# Patient Record
Sex: Male | Born: 1985 | Race: Black or African American | Hispanic: No | Marital: Married | Smoking: Former smoker
Health system: Southern US, Community
[De-identification: ages and names within clinical notes are randomized; demographics above are authoritative.]

## PROBLEM LIST (undated history)

## (undated) DIAGNOSIS — M1990 Unspecified osteoarthritis, unspecified site: Secondary | ICD-10-CM

## (undated) DIAGNOSIS — T4145XA Adverse effect of unspecified anesthetic, initial encounter: Secondary | ICD-10-CM

## (undated) DIAGNOSIS — T8859XA Other complications of anesthesia, initial encounter: Secondary | ICD-10-CM

## (undated) DIAGNOSIS — M549 Dorsalgia, unspecified: Secondary | ICD-10-CM

## (undated) HISTORY — DX: Unspecified osteoarthritis, unspecified site: M19.90

## (undated) HISTORY — DX: Dorsalgia, unspecified: M54.9

## (undated) HISTORY — PX: MEDIAL COLLATERAL LIGAMENT AND LATERAL COLLATERAL LIGAMENT REPAIR, KNEE: SHX2017

## (undated) HISTORY — PX: ANTERIOR CRUCIATE LIGAMENT REPAIR: SHX115

---
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
## 2010-07-22 HISTORY — PX: MEDIAL COLLATERAL LIGAMENT AND LATERAL COLLATERAL LIGAMENT REPAIR, KNEE: SHX2017

## 2016-12-23 DIAGNOSIS — M9903 Segmental and somatic dysfunction of lumbar region: Secondary | ICD-10-CM | POA: Diagnosis not present

## 2016-12-23 DIAGNOSIS — M5416 Radiculopathy, lumbar region: Secondary | ICD-10-CM | POA: Diagnosis not present

## 2016-12-23 DIAGNOSIS — M9902 Segmental and somatic dysfunction of thoracic region: Secondary | ICD-10-CM | POA: Diagnosis not present

## 2016-12-23 DIAGNOSIS — M5431 Sciatica, right side: Secondary | ICD-10-CM | POA: Diagnosis not present

## 2018-04-10 ENCOUNTER — Ambulatory Visit (INDEPENDENT_AMBULATORY_CARE_PROVIDER_SITE_OTHER): Payer: BLUE CROSS/BLUE SHIELD | Admitting: Family Medicine

## 2018-04-10 ENCOUNTER — Encounter: Payer: Self-pay | Admitting: Family Medicine

## 2018-04-10 VITALS — BP 124/78 | HR 85 | Ht 72.0 in | Wt 374.5 lb

## 2018-04-10 DIAGNOSIS — Z114 Encounter for screening for human immunodeficiency virus [HIV]: Secondary | ICD-10-CM | POA: Diagnosis not present

## 2018-04-10 DIAGNOSIS — Z13220 Encounter for screening for lipoid disorders: Secondary | ICD-10-CM | POA: Diagnosis not present

## 2018-04-10 DIAGNOSIS — M5441 Lumbago with sciatica, right side: Secondary | ICD-10-CM | POA: Diagnosis not present

## 2018-04-10 DIAGNOSIS — G8929 Other chronic pain: Secondary | ICD-10-CM

## 2018-04-10 DIAGNOSIS — R030 Elevated blood-pressure reading, without diagnosis of hypertension: Secondary | ICD-10-CM | POA: Diagnosis not present

## 2018-04-10 LAB — MICROALBUMIN, URINE WAIVED
Creatinine, Urine Waived: 200 mg/dL (ref 10–300)
MICROALB, UR WAIVED: 80 mg/L — AB (ref 0–19)

## 2018-04-10 LAB — UA/M W/RFLX CULTURE, ROUTINE
Bilirubin, UA: NEGATIVE
GLUCOSE, UA: NEGATIVE
Ketones, UA: NEGATIVE
Nitrite, UA: NEGATIVE
PH UA: 6 (ref 5.0–7.5)
PROTEIN UA: NEGATIVE
RBC, UA: NEGATIVE
Specific Gravity, UA: 1.02 (ref 1.005–1.030)
Urobilinogen, Ur: 0.2 mg/dL (ref 0.2–1.0)

## 2018-04-10 LAB — MICROSCOPIC EXAMINATION: BACTERIA UA: NONE SEEN

## 2018-04-10 LAB — BAYER DCA HB A1C WAIVED: HB A1C (BAYER DCA - WAIVED): 6.1 % (ref <7.0)

## 2018-04-10 NOTE — Patient Instructions (Addendum)
Back Exercises  The following exercises strengthen the muscles that help to support the back. They also help to keep the lower back flexible. Doing these exercises can help to prevent back pain or lessen existing pain.  If you have back pain or discomfort, try doing these exercises 2–3 times each day or as told by your health care provider. When the pain goes away, do them once each day, but increase the number of times that you repeat the steps for each exercise (do more repetitions). If you do not have back pain or discomfort, do these exercises once each day or as told by your health care provider.  Exercises  Single Knee to Chest    Repeat these steps 3–5 times for each leg:  1. Lie on your back on a firm bed or the floor with your legs extended.  2. Bring one knee to your chest. Your other leg should stay extended and in contact with the floor.  3. Hold your knee in place by grabbing your knee or thigh.  4. Pull on your knee until you feel a gentle stretch in your lower back.  5. Hold the stretch for 10–30 seconds.  6. Slowly release and straighten your leg.    Pelvic Tilt    Repeat these steps 5–10 times:  1. Lie on your back on a firm bed or the floor with your legs extended.  2. Bend your knees so they are pointing toward the ceiling and your feet are flat on the floor.  3. Tighten your lower abdominal muscles to press your lower back against the floor. This motion will tilt your pelvis so your tailbone points up toward the ceiling instead of pointing to your feet or the floor.  4. With gentle tension and even breathing, hold this position for 5–10 seconds.    Cat–Cow    Repeat these steps until your lower back becomes more flexible:  1. Get into a hands-and-knees position on a firm surface. Keep your hands under your shoulders, and keep your knees under your hips. You may place padding under your knees for comfort.  2. Let your head hang down, and point your tailbone toward the floor so  your lower back becomes rounded like the back of a cat.  3. Hold this position for 5 seconds.  4. Slowly lift your head and point your tailbone up toward the ceiling so your back forms a sagging arch like the back of a cow.  5. Hold this position for 5 seconds.    Press-Ups    Repeat these steps 5–10 times:  1. Lie on your abdomen (face-down) on the floor.  2. Place your palms near your head, about shoulder-width apart.  3. While you keep your back as relaxed as possible and keep your hips on the floor, slowly straighten your arms to raise the top half of your body and lift your shoulders. Do not use your back muscles to raise your upper torso. You may adjust the placement of your hands to make yourself more comfortable.  4. Hold this position for 5 seconds while you keep your back relaxed.  5. Slowly return to lying flat on the floor.    Bridges    Repeat these steps 10 times:  1. Lie on your back on a firm surface.  2. Bend your knees so they are pointing toward the ceiling and your feet are flat on the floor.  3. Tighten your buttocks muscles and lift your buttocks   off of the floor until your waist is at almost the same height as your knees. You should feel the muscles working in your buttocks and the back of your thighs. If you do not feel these muscles, slide your feet 1–2 inches farther away from your buttocks.  4. Hold this position for 3–5 seconds.  5. Slowly lower your hips to the starting position, and allow your buttocks muscles to relax completely.    If this exercise is too easy, try doing it with your arms crossed over your chest.  Abdominal Crunches    Repeat these steps 5–10 times:  1. Lie on your back on a firm bed or the floor with your legs extended.  2. Bend your knees so they are pointing toward the ceiling and your feet are flat on the floor.  3. Cross your arms over your chest.  4. Tip your chin slightly toward your chest without bending your neck.   5. Tighten your abdominal muscles and slowly raise your trunk (torso) high enough to lift your shoulder blades a tiny bit off of the floor. Avoid raising your torso higher than that, because it can put too much stress on your low back and it does not help to strengthen your abdominal muscles.  6. Slowly return to your starting position.    Back Lifts  Repeat these steps 5–10 times:  1. Lie on your abdomen (face-down) with your arms at your sides, and rest your forehead on the floor.  2. Tighten the muscles in your legs and your buttocks.  3. Slowly lift your chest off of the floor while you keep your hips pressed to the floor. Keep the back of your head in line with the curve in your back. Your eyes should be looking at the floor.  4. Hold this position for 3–5 seconds.  5. Slowly return to your starting position.    Contact a health care provider if:  · Your back pain or discomfort gets much worse when you do an exercise.  · Your back pain or discomfort does not lessen within 2 hours after you exercise.  If you have any of these problems, stop doing these exercises right away. Do not do them again unless your health care provider says that you can.  Get help right away if:  · You develop sudden, severe back pain. If this happens, stop doing the exercises right away. Do not do them again unless your health care provider says that you can.  This information is not intended to replace advice given to you by your health care provider. Make sure you discuss any questions you have with your health care provider.  Document Released: 08/15/2004 Document Revised: 11/15/2015 Document Reviewed: 09/01/2014  Elsevier Interactive Patient Education © 2017 [employer]  DASH Eating Plan  DASH stands for "Dietary Approaches to Stop Hypertension." The DASH eating plan is a healthy eating plan that has been shown to reduce high blood pressure (hypertension). It may also reduce your risk for type 2 diabetes,  heart disease, and stroke. The DASH eating plan may also help with weight loss.  What are tips for following this plan?  General guidelines  · Avoid eating more than 2,300 mg (milligrams) of salt (sodium) a day. If you have hypertension, you may need to reduce your sodium intake to 1,500 mg a day.  · Limit alcohol intake to no more than 1 drink a day for nonpregnant women and 2 drinks a day for men.   One drink equals 12 oz of beer, 5 oz of wine, or 1½ oz of hard liquor.  · Work with your health care provider to maintain a healthy body weight or to lose weight. Ask what an ideal weight is for you.  · Get at least 30 minutes of exercise that causes your heart to beat faster (aerobic exercise) most days of the week. Activities may include walking, swimming, or biking.  · Work with your health care provider or diet and nutrition specialist (dietitian) to adjust your eating plan to your individual calorie needs.  Reading food labels  · Check food labels for the amount of sodium per serving. Choose foods with less than 5 percent of the Daily Value of sodium. Generally, foods with less than 300 mg of sodium per serving fit into this eating plan.  · To find whole grains, look for the word "whole" as the first word in the ingredient list.  Shopping  · Buy products labeled as "low-sodium" or "no salt added."  · Buy fresh foods. Avoid canned foods and premade or frozen meals.  Cooking  · Avoid adding salt when cooking. Use salt-free seasonings or herbs instead of table salt or sea salt. Check with your health care provider or pharmacist before using salt substitutes.  · Do not fry foods. Cook foods using healthy methods such as baking, boiling, grilling, and broiling instead.  · Cook with heart-healthy oils, such as olive, canola, soybean, or sunflower oil.  Meal planning    · Eat a balanced diet that includes:  ? 5 or more servings of fruits and vegetables each day. At each meal, try  to fill half of your plate with fruits and vegetables.  ? Up to 6–8 servings of whole grains each day.  ? Less than 6 oz of lean meat, poultry, or fish each day. A 3-oz serving of meat is about the same size as a deck of cards. One egg equals 1 oz.  ? 2 servings of low-fat dairy each day.  ? A serving of nuts, seeds, or beans 5 times each week.  ? Heart-healthy fats. Healthy fats called Omega-3 fatty acids are found in foods such as flaxseeds and coldwater fish, like sardines, salmon, and mackerel.  · Limit how much you eat of the following:  ? Canned or prepackaged foods.  ? Food that is high in trans fat, such as fried foods.  ? Food that is high in saturated fat, such as fatty meat.  ? Sweets, desserts, sugary drinks, and other foods with added sugar.  ? Full-fat dairy products.  · Do not salt foods before eating.  · Try to eat at least 2 vegetarian meals each week.  · Eat more home-cooked food and less restaurant, buffet, and fast food.  · When eating at a restaurant, ask that your food be prepared with less salt or no salt, if possible.  What foods are recommended?  The items listed may not be a complete list. Talk with your dietitian about what dietary choices are best for you.  Grains  Whole-grain or whole-wheat bread. Whole-grain or whole-wheat pasta. Brown rice. Oatmeal. Quinoa. Bulgur. Whole-grain and low-sodium cereals. Pita bread. Low-fat, low-sodium crackers. Whole-wheat flour tortillas.  Vegetables  Fresh or frozen vegetables (raw, steamed, roasted, or grilled). Low-sodium or reduced-sodium tomato and vegetable juice. Low-sodium or reduced-sodium tomato sauce and tomato paste. Low-sodium or reduced-sodium canned vegetables.  Fruits  All fresh, dried, or frozen fruit. Canned fruit in natural juice (without   added sugar).  Meat and other protein foods  Skinless chicken or turkey. Ground chicken or turkey. Pork with fat trimmed off. Fish and seafood. Egg whites. Dried beans, peas, or lentils.  Unsalted nuts, nut butters, and seeds. Unsalted canned beans. Lean cuts of beef with fat trimmed off. Low-sodium, lean deli meat.  Dairy  Low-fat (1%) or fat-free (skim) milk. Fat-free, low-fat, or reduced-fat cheeses. Nonfat, low-sodium ricotta or cottage cheese. Low-fat or nonfat yogurt. Low-fat, low-sodium cheese.  Fats and oils  Soft margarine without trans fats. Vegetable oil. Low-fat, reduced-fat, or light mayonnaise and salad dressings (reduced-sodium). Canola, safflower, olive, soybean, and sunflower oils. Avocado.  Seasoning and other foods  Herbs. Spices. Seasoning mixes without salt. Unsalted popcorn and pretzels. Fat-free sweets.  What foods are not recommended?  The items listed may not be a complete list. Talk with your dietitian about what dietary choices are best for you.  Grains  Baked goods made with fat, such as croissants, muffins, or some breads. Dry pasta or rice meal packs.  Vegetables  Creamed or fried vegetables. Vegetables in a cheese sauce. Regular canned vegetables (not low-sodium or reduced-sodium). Regular canned tomato sauce and paste (not low-sodium or reduced-sodium). Regular tomato and vegetable juice (not low-sodium or reduced-sodium). Pickles. Olives.  Fruits  Canned fruit in a light or heavy syrup. Fried fruit. Fruit in cream or butter sauce.  Meat and other protein foods  Fatty cuts of meat. Ribs. Fried meat. Bacon. Sausage. Bologna and other processed lunch meats. Salami. Fatback. Hotdogs. Bratwurst. Salted nuts and seeds. Canned beans with added salt. Canned or smoked fish. Whole eggs or egg yolks. Chicken or turkey with skin.  Dairy  Whole or 2% milk, cream, and half-and-half. Whole or full-fat cream cheese. Whole-fat or sweetened yogurt. Full-fat cheese. Nondairy creamers. Whipped toppings. Processed cheese and cheese spreads.  Fats and oils  Butter. Stick margarine. Lard. Shortening. Ghee. Bacon fat. Tropical oils, such as coconut, palm kernel, or palm oil.   Seasoning and other foods  Salted popcorn and pretzels. Onion salt, garlic salt, seasoned salt, table salt, and sea salt. Worcestershire sauce. Tartar sauce. Barbecue sauce. Teriyaki sauce. Soy sauce, including reduced-sodium. Steak sauce. Canned and packaged gravies. Fish sauce. Oyster sauce. Cocktail sauce. Horseradish that you find on the shelf. Ketchup. Mustard. Meat flavorings and tenderizers. Bouillon cubes. Hot sauce and Tabasco sauce. Premade or packaged marinades. Premade or packaged taco seasonings. Relishes. Regular salad dressings.  Where to find more information:  · National Heart, Lung, and Blood Institute: www.nhlbi.nih.gov  · American Heart Association: www.heart.org  Summary  · The DASH eating plan is a healthy eating plan that has been shown to reduce high blood pressure (hypertension). It may also reduce your risk for type 2 diabetes, heart disease, and stroke.  · With the DASH eating plan, you should limit salt (sodium) intake to 2,300 mg a day. If you have hypertension, you may need to reduce your sodium intake to 1,500 mg a day.  · When on the DASH eating plan, aim to eat more fresh fruits and vegetables, whole grains, lean proteins, low-fat dairy, and heart-healthy fats.  · Work with your health care provider or diet and nutrition specialist (dietitian) to adjust your eating plan to your individual calorie needs.  This information is not intended to replace advice given to you by your health care provider. Make sure you discuss any questions you have with your health care provider.  Document Released: 06/27/2011 Document Revised: 07/01/2016 Document Reviewed: 07/01/2016    Elsevier Interactive Patient Education © 2018 [employer]
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
## 2018-04-10 NOTE — Assessment & Plan Note (Signed)
Discussed slow, steady weight loss of 1-2lbs/week. DASH diet given. Increase exercise. Checking labs today. Await results.

## 2018-04-11 LAB — CBC WITH DIFFERENTIAL/PLATELET
BASOS ABS: 0 10*3/uL (ref 0.0–0.2)
BASOS: 0 %
EOS (ABSOLUTE): 0.2 10*3/uL (ref 0.0–0.4)
Eos: 3 %
HEMOGLOBIN: 14.1 g/dL (ref 13.0–17.7)
Hematocrit: 40.4 % (ref 37.5–51.0)
IMMATURE GRANULOCYTES: 0 %
Immature Grans (Abs): 0 10*3/uL (ref 0.0–0.1)
LYMPHS: 26 %
Lymphocytes Absolute: 1.5 10*3/uL (ref 0.7–3.1)
MCH: 29 pg (ref 26.6–33.0)
MCHC: 34.9 g/dL (ref 31.5–35.7)
MCV: 83 fL (ref 79–97)
MONOCYTES: 5 %
Monocytes Absolute: 0.3 10*3/uL (ref 0.1–0.9)
NEUTROS ABS: 3.8 10*3/uL (ref 1.4–7.0)
NEUTROS PCT: 66 %
Platelets: 252 10*3/uL (ref 150–450)
RBC: 4.87 x10E6/uL (ref 4.14–5.80)
RDW: 13.7 % (ref 12.3–15.4)
WBC: 5.7 10*3/uL (ref 3.4–10.8)

## 2018-04-11 LAB — COMPREHENSIVE METABOLIC PANEL
ALBUMIN: 4.7 g/dL (ref 3.5–5.5)
ALT: 33 IU/L (ref 0–44)
AST: 28 IU/L (ref 0–40)
Albumin/Globulin Ratio: 2 (ref 1.2–2.2)
Alkaline Phosphatase: 101 IU/L (ref 39–117)
BILIRUBIN TOTAL: 0.3 mg/dL (ref 0.0–1.2)
BUN / CREAT RATIO: 11 (ref 9–20)
BUN: 11 mg/dL (ref 6–20)
CALCIUM: 9.5 mg/dL (ref 8.7–10.2)
CO2: 23 mmol/L (ref 20–29)
CREATININE: 0.96 mg/dL (ref 0.76–1.27)
Chloride: 100 mmol/L (ref 96–106)
GFR, EST AFRICAN AMERICAN: 119 mL/min/{1.73_m2} (ref >59)
GFR, EST NON AFRICAN AMERICAN: 103 mL/min/{1.73_m2} (ref >59)
GLUCOSE: 120 mg/dL — AB (ref 65–99)
Globulin, Total: 2.4 g/dL (ref 1.5–4.5)
Potassium: 4.3 mmol/L (ref 3.5–5.2)
Sodium: 139 mmol/L (ref 134–144)
Total Protein: 7.1 g/dL (ref 6.0–8.5)

## 2018-04-11 LAB — LIPID PANEL W/O CHOL/HDL RATIO
Cholesterol, Total: 192 mg/dL (ref 100–199)
HDL: 39 mg/dL — ABNORMAL LOW (ref >39)
LDL Calculated: 136 mg/dL — ABNORMAL HIGH (ref 0–99)
Triglycerides: 87 mg/dL (ref 0–149)
VLDL CHOLESTEROL CAL: 17 mg/dL (ref 5–40)

## 2018-04-11 LAB — HIV ANTIBODY (ROUTINE TESTING W REFLEX): HIV Screen 4th Generation wRfx: NONREACTIVE

## 2018-04-11 LAB — TSH: TSH: 1.33 u[IU]/mL (ref 0.450–4.500)

## 2018-04-13 ENCOUNTER — Encounter: Payer: Self-pay | Admitting: Family Medicine

## 2018-06-02 ENCOUNTER — Encounter: Payer: Self-pay | Admitting: Family Medicine

## 2018-06-02 ENCOUNTER — Ambulatory Visit: Payer: BLUE CROSS/BLUE SHIELD | Admitting: Family Medicine

## 2018-06-02 VITALS — BP 148/75 | HR 72 | Temp 97.8°F | Wt 358.0 lb

## 2018-06-02 DIAGNOSIS — M25512 Pain in left shoulder: Secondary | ICD-10-CM

## 2018-06-02 DIAGNOSIS — M25511 Pain in right shoulder: Secondary | ICD-10-CM | POA: Diagnosis not present

## 2018-06-02 DIAGNOSIS — M7520 Bicipital tendinitis, unspecified shoulder: Secondary | ICD-10-CM

## 2018-06-02 DIAGNOSIS — M7662 Achilles tendinitis, left leg: Secondary | ICD-10-CM | POA: Diagnosis not present

## 2018-06-02 MED ORDER — NAPROXEN 500 MG PO TABS: 500.0000 mg | ORAL_TABLET | Freq: Two times a day (BID) | ORAL | 3 refills | Status: DC

## 2018-06-02 NOTE — Patient Instructions (Signed)
Achilles Tendinitis Rehab Ask your health care provider which exercises are safe for you. Do exercises exactly as told by your health care provider and adjust them as directed. It is normal to feel mild stretching, pulling, tightness, or discomfort as you do these exercises, but you should stop right away if you feel sudden pain or your pain gets worse. Do not begin these exercises until told by your health care provider. Stretching and range of motion exercises These exercises warm up your muscles and joints and improve the movement and flexibility of your ankle. These exercises also help to relieve pain, numbness, and tingling. Exercise A: Standing wall calf stretch, knee straight  1. Stand with your hands against a wall. 2. Extend your __________ leg behind you and bend your front knee slightly. Keep both of your heels on the floor. 3. Point the toes of your back foot slightly inward. 4. Keeping your heels on the floor and your back knee straight, shift your weight toward the wall. Do not allow your back to arch. You should feel a gentle stretch in your calf. 5. Hold this position for seconds. Repeat __________ times. Complete this stretch __________ times per day. Exercise B: Standing wall calf stretch, knee bent 1. Stand with your hands against a wall. 2. Extend your __________ leg behind you, and bend your front knee slightly. Keep both of your heels on the floor. 3. Point the toes of your back foot slightly inward. 4. Keeping your heels on the floor, unlock your back knee so that it is bent. You should feel a gentle stretch deep in your calf. 5. Hold this position for __________ seconds. Repeat __________ times. Complete this stretch __________ times per day. Strengthening exercises These exercises build strength and control of your ankle. Endurance is the ability to use your muscles for a long time, even after they get tired. Exercise C: Plantar flexion with band  1. Sit on the floor  with your __________ leg extended. You may put a pillow under your calf to give your foot more room to move. 2. Loop a rubber exercise band or tube around the ball of your __________ foot. The ball of your foot is on the walking surface, right under your toes. The band or tube should be slightly tense when your foot is relaxed. If the band or tube slips, you can put on your shoe or put a washcloth between the band and your foot to help it stay in place. 3. Slowly point your toes downward, pushing them away from you. 4. Hold this position for __________ seconds. 5. Slowly release the tension in the band or tube, controlling smoothly until your foot is back to the starting position. Repeat __________ times. Complete this exercise __________ times per day. Exercise D: Heel raise with eccentric lower  1. Stand on a step with the balls of your feet. The ball of your foot is on the walking surface, right under your toes. ? Do not put your heels on the step. ? For balance, rest your hands on the wall or on a railing. 2. Rise up onto the balls of your feet. 3. Keeping your heels up, shift all of your weight to your __________ leg and pick up your other leg. 4. Slowly lower your __________ leg so your heel drops below the level of the step. 5. Put down your foot. If told by your health care provider, build up to:  3 sets of 15 repetitions while keeping your knees   straight.  3 sets of 15 repetitions while keeping your knees bent as far as told by your health care provider.  Complete this exercise __________ times per day. If this exercise is too easy, try doing it while wearing a backpack with weights in it. Balance exercises These exercises improve or maintain your balance. Balance is important in preventing falls. Exercise E: Single leg stand 1. Without shoes, stand near a railing or in a door frame. Hold on to the railing or door frame as needed. 2. Stand on your __________ foot. Keep your big  toe down on the floor and try to keep your arch lifted. 3. Hold this position for __________ seconds. Repeat __________ times. Complete this exercise __________ times per day. If this exercise is too easy, you can try it with your eyes closed or while standing on a pillow. This information is not intended to replace advice given to you by your health care provider. Make sure you discuss any questions you have with your health care provider. Document Released: 02/06/2005 Document Revised: 03/14/2016 Document Reviewed: 03/14/2015 Elsevier Interactive Patient Education  2018 [employer]  Biceps Tendon Disruption (Proximal) Rehab Ask your health care provider which exercises are safe for you. Do exercises exactly as told by your health care provider and adjust them as directed. It is normal to feel mild stretching, pulling, tightness, or discomfort as you do these exercises, but you should stop right away if you feel sudden pain or your pain gets worse.Do not begin these exercises until told by your health care provider. Stretching and range of motion exercises These exercises warm up your muscles and joints and improve the movement and flexibility of your arm and shoulder. These exercises also help to relieve pain and stiffness. Exercise A: Shoulder flexion, standing  1. Stand facing a wall. Put your left / right hand on the wall. 2. Slide your left / right hand up the wall. Stop when you feel a stretch in your shoulder, or when you reach the angle recommended by your health care provider. ? Use your other hand to help raise your arm, if needed. ? As your hand gets higher, you may need to step closer to the wall. ? Avoid shrugging your shoulder while you raise your arm. To do this, keep your shoulder blade tucked down toward your spine. 3. Hold for __________ seconds. 4. Slowly return to the starting position. Use your other arm to help, if needed. Repeat __________ times. Complete this  exercise __________ times a day. Exercise B: Pendulum  1. Stand near a wall or a surface that you can hold onto for balance. 2. Bend at the waist and let your left / right arm hang straight down. Use your other arm to support you. 3. Relax your arm and shoulder muscles, and move your hips and your trunk so your left / right arm swings freely. Your arm should swing because of the motion of your body, not because you are using your arm or shoulder muscles. 4. Keep moving so your arm swings in the following directions, as told by your health care provider: ? Side to side. ? Forward and backward. ? In clockwise and counterclockwise circles. 5. Slowly return to the starting position. Repeat __________ times. Complete this exercise __________ times a day. Strengthening exercises These exercises build strength and endurance in your arm and shoulder. Endurance is the ability to use your muscles for a long time, even after your muscles get tired. Exercise C: Elbow  flexion, neutral 1. Sit on a stable chair without armrests, or stand. 2. Hold a __________ weight in your left / right hand, or hold an exercise band with both hands. Your palms should face each other at the starting position. 3. Bend your left / right elbow and move your hand up toward your shoulder. ? Lead with your thumb, and keep your palm facing the same direction. ? Keep your other arm straight down, in the starting position. 4. Slowly return to the starting position. Repeat __________ times. Complete this exercise __________ times a day. Exercise D: Forearm supination  1. Sit with your left / right forearm on a table. Your elbow should be below shoulder height. Rest your hand over the edge of the table so your palm faces down. 2. If directed, hold a hammer with your left / right hand. 3. Without moving your elbow, slowly rotate your hand so your palm faces up toward the ceiling. ? If you are holding a hammer, begin by holding the  hammer near the head. When this exercise gets easier for you, hold the hammer farther down the handle. 4. Hold for __________ seconds. 5. Slowly return to the starting position. Repeat __________ times. Complete this exercise __________ times a day. Exercise E: Scapular retraction  1. Sit in a stable chair without armrests, or stand. 2. Secure an exercise band to a stable object in front of you so the band is at shoulder height. 3. Hold one end of the exercise band in each hand. 4. Squeeze your shoulder blades together and move your elbows slightly behind you. Do not shrug your shoulders. 5. Hold for __________ seconds. 6. Slowly return to the starting position. Repeat __________ times. Complete this exercise __________ times a day. Exercise F: Scapular protraction, supine  1. Lie on your back on a firm surface. Hold a __________ weight in your left / right hand. 2. Raise your left / right arm straight into the air so your hand is directly above your shoulder joint. 3. Push the weight into the air so your shoulder lifts off of the surface that you are lying on. Do not move your head, neck, or back. 4. Hold for __________ seconds. 5. Slowly return to the starting position. Let your muscles relax completely before you repeat this exercise. Repeat __________ times. Complete this exercise __________ times a day. This information is not intended to replace advice given to you by your health care provider. Make sure you discuss any questions you have with your health care provider. Document Released: 07/08/2005 Document Revised: 03/14/2016 Document Reviewed: 06/16/2015 Elsevier Interactive Patient Education  2018 [employer]  Shoulder Impingement Syndrome Rehab Ask your health care provider which exercises are safe for you. Do exercises exactly as told by your health care provider and adjust them as directed. It is normal to feel mild stretching, pulling, tightness, or discomfort as you do  these exercises, but you should stop right away if you feel sudden pain or your pain gets worse.Do not begin these exercises until told by your health care provider. Stretching and range of motion exercise This exercise warms up your muscles and joints and improves the movement and flexibility of your shoulder. This exercise also helps to relieve pain and stiffness. Exercise A: Passive horizontal adduction  1. Sit or stand and pull your left / right elbow across your chest, toward your other shoulder. Stop when you feel a gentle stretch in the back of your shoulder and upper arm. ?  Keep your arm at shoulder height. ? Keep your arm as close to your body as you comfortably can. 2. Hold for __________ seconds. 3. Slowly return to the starting position. Repeat __________ times. Complete this exercise __________ times a day. Strengthening exercises These exercises build strength and endurance in your shoulder. Endurance is the ability to use your muscles for a long time, even after they get tired. Exercise B: External rotation, isometric 1. Stand or sit in a doorway, facing the door frame. 2. Bend your left / right elbow and place the back of your wrist against the door frame. Only your wrist should be touching the frame. Keep your upper arm at your side. 3. Gently press your wrist against the door frame, as if you are trying to push your arm away from your abdomen. ? Avoid shrugging your shoulder while you press your hand against the door frame. Keep your shoulder blade tucked down toward the middle of your back. 4. Hold for __________ seconds. 5. Slowly release the tension, and relax your muscles completely before you do the exercise again. Repeat __________ times. Complete this exercise __________ times a day. Exercise C: Internal rotation, isometric  1. Stand or sit in a doorway, facing the door frame. 2. Bend your left / right elbow and place the inside of your wrist against the door frame.  Only your wrist should be touching the frame. Keep your upper arm at your side. 3. Gently press your wrist against the door frame, as if you are trying to push your arm toward your abdomen. ? Avoid shrugging your shoulder while you press your hand against the door frame. Keep your shoulder blade tucked down toward the middle of your back. 4. Hold for __________ seconds. 5. Slowly release the tension, and relax your muscles completely before you do the exercise again. Repeat __________ times. Complete this exercise __________ times a day. Exercise D: Scapular protraction, supine  1. Lie on your back on a firm surface. Hold a __________ weight in your left / right hand. 2. Raise your left / right arm straight into the air so your hand is directly above your shoulder joint. 3. Push the weight into the air so your shoulder lifts off of the surface that you are lying on. Do not move your head, neck, or back. 4. Hold for __________ seconds. 5. Slowly return to the starting position. Let your muscles relax completely before you repeat this exercise. Repeat __________ times. Complete this exercise __________ times a day. Exercise E: Scapular retraction  1. Sit in a stable chair without armrests, or stand. 2. Secure an exercise band to a stable object in front of you so the band is at shoulder height. 3. Hold one end of the exercise band in each hand. Your palms should face down. 4. Squeeze your shoulder blades together and move your elbows slightly behind you. Do not shrug your shoulders while you do this. 5. Hold for __________ seconds. 6. Slowly return to the starting position. Repeat __________ times. Complete this exercise __________ times a day. Exercise F: Shoulder extension  1. Sit in a stable chair without armrests, or stand. 2. Secure an exercise band to a stable object in front of you where the band is above shoulder height. 3. Hold one end of the exercise band in each  hand. 4. Straighten your elbows and lift your hands up to shoulder height. 5. Squeeze your shoulder blades together and pull your hands down to the sides of  your thighs. Stop when your hands are straight down by your sides. Do not let your hands go behind your body. 6. Hold for __________ seconds. 7. Slowly return to the starting position. Repeat __________ times. Complete this exercise __________ times a day. This information is not intended to replace advice given to you by your health care provider. Make sure you discuss any questions you have with your health care provider. Document Released: 07/08/2005 Document Revised: 03/14/2016 Document Reviewed: 06/10/2015 Elsevier Interactive Patient Education  [employer].

## 2018-06-02 NOTE — Progress Notes (Signed)
BP (!) 148/75   Pulse 72   Temp 97.8 F (36.6 C) (Oral)   Wt (!) 358 lb (162.4 kg)   SpO2 100%   BMI 48.55 kg/m    Subjective:    Patient ID: Jimmy Flores, male    DOB: 09-05-1985, 32 y.o.   MRN: [id number]  HPI: Jimmy Flores is a 32 y.o. male  Chief Complaint  Patient presents with  . Shoulder Pain  . Tendonitis   SHOULDER PAIN-Has been trying to get back into shape. In the gym, doing weights. Has been having some pain in his shoulders Duration: past few weeks Involved shoulder: bilateral Mechanism of injury: lifting at the gym Location: anterior Onset:gradual Severity: moderate  Quality:  Aching and sore, also shooting down his L arm Frequency: intermittent Radiation: yes Aggravating factors: lifting  Alleviating factors: rest  Status: worse Treatments attempted: rest  Relief with NSAIDs?:  No NSAIDs Taken Weakness: no Numbness: no Decreased grip strength: no Redness: no Swelling: no Bruising: no Fevers: no   Achilles also seems to be acting up   Relevant past medical, surgical, family and social history reviewed and updated as indicated. Interim medical history since our last visit reviewed. Allergies and medications reviewed and updated.  Review of Systems  Constitutional: Negative.   Respiratory: Negative.   Cardiovascular: Negative.   Musculoskeletal: Positive for arthralgias and myalgias. Negative for back pain, gait problem, joint swelling, neck pain and neck stiffness.  Skin: Negative.   Neurological: Negative.   Hematological: Negative.   Psychiatric/Behavioral: Negative.     Per HPI unless specifically indicated above     Objective:    BP (!) 148/75   Pulse 72   Temp 97.8 F (36.6 C) (Oral)   Wt (!) 358 lb (162.4 kg)   SpO2 100%   BMI 48.55 kg/m   Wt Readings from Last 3 Encounters:  06/02/18 (!) 358 lb (162.4 kg)  04/10/18 (!) 374 lb 8 oz (169.9 kg)    Physical Exam  Constitutional: He is oriented to person, place,  and time. He appears well-developed and well-nourished. No distress.  HENT:  Head: Normocephalic and atraumatic.  Right Ear: Hearing normal.  Left Ear: Hearing normal.  Nose: Nose normal.  Eyes: Conjunctivae and lids are normal. Right eye exhibits no discharge. Left eye exhibits no discharge. No scleral icterus.  Cardiovascular: Normal rate, regular rhythm, normal heart sounds and intact distal pulses. Exam reveals no gallop and no friction rub.  No murmur heard. Pulmonary/Chest: Effort normal and breath sounds normal. No stridor. No respiratory distress. He has no wheezes. He has no rales. He exhibits no tenderness.  Musculoskeletal: Normal range of motion. He exhibits tenderness. He exhibits no edema or deformity.  FROM of shoulders, tender over AC joints bilaterally, tenderness over bicipital grove on the L, tenderness to achilles tendon on the L  Neurological: He is alert and oriented to person, place, and time.  Skin: Skin is warm, dry and intact. Capillary refill takes less than 2 seconds. No rash noted. He is not diaphoretic. No erythema. No pallor.  Psychiatric: He has a normal mood and affect. His speech is normal and behavior is normal. Judgment and thought content normal. Cognition and memory are normal.  Nursing note and vitals reviewed.   Results for orders placed or performed in visit on 04/10/18  Microscopic Examination  Result Value Ref Range   WBC, UA 0-5 0 - 5 /hpf   RBC, UA 0-2 0 - 2 /hpf  Epithelial Cells (non renal) CANCELED    Bacteria, UA None seen None seen/Few  HIV Antibody (routine testing w rflx)  Result Value Ref Range   HIV Screen 4th Generation wRfx Non Reactive Non Reactive  Bayer DCA Hb A1c Waived  Result Value Ref Range   HB A1C (BAYER DCA - WAIVED) 6.1 <7.0 %  CBC with Differential/Platelet  Result Value Ref Range   WBC 5.7 3.4 - 10.8 x10E3/uL   RBC 4.87 4.14 - 5.80 x10E6/uL   Hemoglobin 14.1 13.0 - 17.7 g/dL   Hematocrit 16.1 09.6 - 51.0 %    MCV 83 79 - 97 fL   MCH 29.0 26.6 - 33.0 pg   MCHC 34.9 31.5 - 35.7 g/dL   RDW 04.5 40.9 - 81.1 %   Platelets 252 150 - 450 x10E3/uL   Neutrophils 66 Not Estab. %   Lymphs 26 Not Estab. %   Monocytes 5 Not Estab. %   Eos 3 Not Estab. %   Basos 0 Not Estab. %   Neutrophils Absolute 3.8 1.4 - 7.0 x10E3/uL   Lymphocytes Absolute 1.5 0.7 - 3.1 x10E3/uL   Monocytes Absolute 0.3 0.1 - 0.9 x10E3/uL   EOS (ABSOLUTE) 0.2 0.0 - 0.4 x10E3/uL   Basophils Absolute 0.0 0.0 - 0.2 x10E3/uL   Immature Granulocytes 0 Not Estab. %   Immature Grans (Abs) 0.0 0.0 - 0.1 x10E3/uL  Comprehensive metabolic panel  Result Value Ref Range   Glucose 120 (H) 65 - 99 mg/dL   BUN 11 6 - 20 mg/dL   Creatinine, Ser 9.14 0.76 - 1.27 mg/dL   GFR calc non Af Amer 103 >59 mL/min/1.73   GFR calc Af Amer 119 >59 mL/min/1.73   BUN/Creatinine Ratio 11 9 - 20   Sodium 139 134 - 144 mmol/L   Potassium 4.3 3.5 - 5.2 mmol/L   Chloride 100 96 - 106 mmol/L   CO2 23 20 - 29 mmol/L   Calcium 9.5 8.7 - 10.2 mg/dL   Total Protein 7.1 6.0 - 8.5 g/dL   Albumin 4.7 3.5 - 5.5 g/dL   Globulin, Total 2.4 1.5 - 4.5 g/dL   Albumin/Globulin Ratio 2.0 1.2 - 2.2   Bilirubin Total 0.3 0.0 - 1.2 mg/dL   Alkaline Phosphatase 101 39 - 117 IU/L   AST 28 0 - 40 IU/L   ALT 33 0 - 44 IU/L  Lipid Panel w/o Chol/HDL Ratio  Result Value Ref Range   Cholesterol, Total 192 100 - 199 mg/dL   Triglycerides 87 0 - 149 mg/dL   HDL 39 (L) >78 mg/dL   VLDL Cholesterol Cal 17 5 - 40 mg/dL   LDL Calculated 295 (H) 0 - 99 mg/dL  Microalbumin, Urine Waived  Result Value Ref Range   Microalb, Ur Waived 80 (H) 0 - 19 mg/L   Creatinine, Urine Waived 200 10 - 300 mg/dL   Microalb/Creat Ratio 30-300 (H) <30 mg/g  TSH  Result Value Ref Range   TSH 1.330 0.450 - 4.500 uIU/mL  UA/M w/rflx Culture, Routine  Result Value Ref Range   Specific Gravity, UA 1.020 1.005 - 1.030   pH, UA 6.0 5.0 - 7.5   Color, UA Yellow Yellow   Appearance Ur Clear Clear    Leukocytes, UA Trace (A) Negative   Protein, UA Negative Negative/Trace   Glucose, UA Negative Negative   Ketones, UA Negative Negative   RBC, UA Negative Negative   Bilirubin, UA Negative Negative   Urobilinogen, Ur 0.2 0.2 -  1.0 mg/dL   Nitrite, UA Negative Negative   Microscopic Examination See below:       Assessment & Plan:   Problem List Items Addressed This Visit    None    Visit Diagnoses    Biceps tendinitis, unspecified laterality    -  Primary   Naproxen and stretches. If not getting better or getting worse, call and we'll get x-rays and referral to ortho.   Achilles tendinitis of left lower extremity       Naproxen and stretches. If not getting better or getting worse, call and we'll get x-rays and referral to ortho   Arthralgia of both acromioclavicular joints       Naproxen and stretches. If not getting better or getting worse, call and we'll get x-rays and referral to ortho       Follow up plan: Return if symptoms worsen or fail to improve.

## 2018-07-03 ENCOUNTER — Encounter: Payer: Self-pay | Admitting: Nurse Practitioner

## 2018-07-03 ENCOUNTER — Ambulatory Visit: Payer: BLUE CROSS/BLUE SHIELD | Admitting: Nurse Practitioner

## 2018-07-03 VITALS — BP 135/80 | HR 64 | Temp 98.1°F | Ht 72.0 in | Wt 344.6 lb

## 2018-07-03 DIAGNOSIS — M545 Low back pain, unspecified: Secondary | ICD-10-CM | POA: Insufficient documentation

## 2018-07-03 MED ORDER — CYCLOBENZAPRINE HCL 5 MG PO TABS: 5.0000 mg | ORAL_TABLET | Freq: Three times a day (TID) | ORAL | 0 refills | Status: DC | PRN

## 2018-07-03 NOTE — Assessment & Plan Note (Signed)
Acute, with some improvement after chiropractor.  Script for Flexeril 5 MG TID PRN #30 pills sent.  Educated not to take medication prior to work or while driving.  Recommended use of heat and Icy/Hot Lidocaine patches (on during day and off at night) at home.  Biofreeze as needed.  Return for worsening or continued symptoms.
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
## 2018-07-03 NOTE — Patient Instructions (Addendum)
Back Pain, Adult Back pain is very common. The pain often gets better over time. The cause of back pain is usually not dangerous. Most people can learn to manage their back pain on their own. Follow these instructions at home: Watch your back pain for any changes. The following actions may help to lessen any pain you are feeling:  Stay active. Start with short walks on flat ground if you can. Try to walk farther each day.  Exercise regularly as told by your doctor. Exercise helps your back heal faster. It also helps avoid future injury by keeping your muscles strong and flexible.  Do not sit, drive, or stand in one place for more than 30 minutes.  Do not stay in bed. Resting more than 1-2 days can slow down your recovery.  Be careful when you bend or lift an object. Use good form when lifting: ? Bend at your knees. ? Keep the object close to your body. ? Do not twist.  Sleep on a firm mattress. Lie on your side, and bend your knees. If you lie on your back, put a pillow under your knees.  Take medicines only as told by your doctor.  Put ice on the injured area. Put ice in a Cyclobenzaprine tablets What is this medicine? CYCLOBENZAPRINE (sye kloe BEN za preen) is a muscle relaxer. It is used to treat muscle pain, spasms, and stiffness. This medicine may be used for other purposes; ask your health care provider or pharmacist if you have questions. COMMON BRAND NAME(S): Fexmid, Flexeril What should I tell my health care provider before I take this medicine? They need to know if you have any of these conditions: -heart disease, irregular heartbeat, or previous heart attack -liver disease -thyroid problem -an unusual or allergic reaction to cyclobenzaprine, tricyclic antidepressants, lactose, other medicines, foods, dyes, or preservatives -pregnant or trying to get pregnant -breast-feeding How should I use this medicine? Take this medicine by mouth with a glass of water. Follow the  directions on the prescription label. If this medicine upsets your stomach, take it with food or milk. Take your medicine at regular intervals. Do not take it more often than directed. Talk to your pediatrician regarding the use of this medicine in children. Special care may be needed. Overdosage: If you think you have taken too much of this medicine contact a poison control center or emergency room at once. NOTE: This medicine is only for you. Do not share this medicine with others. What if I miss a dose? If you miss a dose, take it as soon as you can. If it is almost time for your next dose, take only that dose. Do not take double or extra doses. What may interact with this medicine? Do not take this medicine with any of the following medications: -certain medicines for fungal infections like fluconazole, itraconazole, ketoconazole, posaconazole, voriconazole -cisapride -dofetilide -dronedarone -halofantrine -levomethadyl -MAOIs like Carbex, Eldepryl, Marplan, Nardil, and Parnate -narcotic medicines for cough -pimozide -thioridazine -ziprasidone This medicine may also interact with the following medications: -alcohol -antihistamines for allergy, cough and cold -certain medicines for anxiety or sleep -certain medicines for cancer -certain medicines for depression like amitriptyline, fluoxetine, sertraline -certain medicines for infection like alfuzosin, chloroquine, clarithromycin, levofloxacin, mefloquine, pentamidine, troleandomycin -certain medicines for irregular heart beat -certain medicines for seizures like phenobarbital, primidone -contrast dyes -general anesthetics like halothane, isoflurane, methoxyflurane, propofol -local anesthetics like lidocaine, pramoxine, tetracaine -medicines that relax muscles for surgery -narcotic medicines for pain -other medicines that  prolong the QT interval (cause an abnormal heart rhythm) -phenothiazines like chlorpromazine, mesoridazine,  prochlorperazine This list may not describe all possible interactions. Give your health care provider a list of all the medicines, herbs, non-prescription drugs, or dietary supplements you use. Also tell them if you smoke, drink alcohol, or use illegal drugs. Some items may interact with your medicine. What should I watch for while using this medicine? Tell your doctor or health care professional if your symptoms do not start to get better or if they get worse. You may get drowsy or dizzy. Do not drive, use machinery, or do anything that needs mental alertness until you know how this medicine affects you. Do not stand or sit up quickly, especially if you are an older patient. This reduces the risk of dizzy or fainting spells. Alcohol may interfere with the effect of this medicine. Avoid alcoholic drinks. If you are taking another medicine that also causes drowsiness, you may have more side effects. Give your health care provider a list of all medicines you use. Your doctor will tell you how much medicine to take. Do not take more medicine than directed. Call emergency for help if you have problems breathing or unusual sleepiness. Your mouth may get dry. Chewing sugarless gum or sucking hard candy, and drinking plenty of water may help. Contact your doctor if the problem does not go away or is severe. What side effects may I notice from receiving this medicine? Side effects that you should report to your doctor or health care professional as soon as possible: -allergic reactions like skin rash, itching or hives, swelling of the face, lips, or tongue -breathing problems -chest pain -fast, irregular heartbeat -hallucinations -seizures -unusually weak or tired Side effects that usually do not require medical attention (report to your doctor or health care professional if they continue or are bothersome): -headache -nausea, vomiting This list may not describe all possible side effects. Call your doctor  for medical advice about side effects. You may report side effects to FDA at 1-800-FDA-1088. Where should I keep my medicine? Keep out of the reach of children. Store at room temperature between 15 and 30 degrees C (59 and 86 degrees F). Keep container tightly closed. Throw away any unused medicine after the expiration date. NOTE: This sheet is a summary. It may not cover all possible information. If you have questions about this medicine, talk to your doctor, pharmacist, or health care provider.  2018 Elsevier/Gold Standard (2015-04-18 12:05:46) ? plastic bag. ? Place a towel between your skin and the bag. ? Leave the ice on for 20 minutes, 2-3 times a day for the first 2-3 days. After that, you can switch between ice and heat packs.  Avoid feeling anxious or stressed. Find good ways to deal with stress, such as exercise.  Maintain a healthy weight. Extra weight puts stress on your back.  Contact a doctor if:  You have pain that does not go away with rest or medicine.  You have worsening pain that goes down into your legs or buttocks.  You have pain that does not get better in one week.  You have pain at night.  You lose weight.  You have a fever or chills. Get help right away if:  You cannot control when you poop (bowel movement) or pee (urinate).  Your arms or legs feel weak.  Your arms or legs lose feeling (numbness).  You feel sick to your stomach (nauseous) or throw up (vomit).  You  have belly (abdominal) pain.  You feel like you may pass out (faint). This information is not intended to replace advice given to you by your health care provider. Make sure you discuss any questions you have with your health care provider. Document Released: 12/25/2007 Document Revised: 12/14/2015 Document Reviewed: 11/09/2013 Elsevier Interactive Patient Education  [employer].

## 2018-08-10 ENCOUNTER — Encounter: Payer: Self-pay | Admitting: Family Medicine

## 2018-08-10 ENCOUNTER — Other Ambulatory Visit: Payer: Self-pay

## 2018-08-11 MED ORDER — CYCLOBENZAPRINE HCL 5 MG PO TABS: 5.0000 mg | ORAL_TABLET | Freq: Three times a day (TID) | ORAL | 0 refills | Status: DC | PRN

## 2018-09-10 ENCOUNTER — Ambulatory Visit: Payer: BLUE CROSS/BLUE SHIELD | Admitting: Family Medicine

## 2018-09-10 ENCOUNTER — Other Ambulatory Visit: Payer: Self-pay

## 2018-09-10 ENCOUNTER — Encounter: Payer: Self-pay | Admitting: Family Medicine

## 2018-09-10 VITALS — BP 116/78 | HR 80 | Temp 98.3°F | Ht 72.01 in | Wt 318.4 lb

## 2018-09-10 DIAGNOSIS — J0100 Acute maxillary sinusitis, unspecified: Secondary | ICD-10-CM | POA: Diagnosis not present

## 2018-09-10 DIAGNOSIS — M5431 Sciatica, right side: Secondary | ICD-10-CM

## 2018-09-10 MED ORDER — PREDNISONE 10 MG PO TABS: ORAL_TABLET | ORAL | 0 refills | Status: DC

## 2018-09-10 MED ORDER — CYCLOBENZAPRINE HCL 5 MG PO TABS: 5.0000 mg | ORAL_TABLET | Freq: Three times a day (TID) | ORAL | 0 refills | Status: DC | PRN
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
## 2018-09-10 NOTE — Patient Instructions (Signed)
Piriformis Syndrome Rehab Ask your health care provider which exercises are safe for you. Do exercises exactly as told by your health care provider and adjust them as directed. It is normal to feel mild stretching, pulling, tightness, or discomfort as you do these exercises, but you should stop right away if you feel sudden pain or your pain gets worse.Do not begin these exercises until told by your health care provider. Stretching and range of motion exercises These exercises warm up your muscles and joints and improve the movement and flexibility of your hip and pelvis. These exercises also help to relieve pain, numbness, and tingling. Exercise A: Hip rotators  1. Lie on your back on a firm surface. 2. Pull your left / right knee toward your same shoulder with your left / right hand until your knee is pointing toward the ceiling. Hold your left / right ankle with your other hand. 3. Keeping your knee steady, gently pull your left / right ankle toward your other shoulder until you feel a stretch in your buttocks. 4. Hold this position for __________ seconds. Repeat __________ times. Complete this stretch __________ times a day. Exercise B: Hip extensors 1. Lie on your back on a firm surface. Both of your legs should be straight. 2. Pull your left / right knee to your chest. Hold your leg in this position by holding onto the back of your thigh or the front of your knee. 3. Hold this position for __________ seconds. 4. Slowly return to the starting position. Repeat __________ times. Complete this stretch __________ times a day. Strengthening exercises These exercises build strength and endurance in your hip and thigh muscles. Endurance is the ability to use your muscles for a long time, even after they get tired. Exercise C: Straight leg raises (hip abductors)  1. Lie on your side with your left / right leg in the top position. Lie so your head, shoulder, knee, and hip line up. Bend your bottom  knee to help you balance. 2. Lift your top leg up 4-6 inches (10-15 cm), keeping your toes pointed straight ahead. 3. Hold this position for __________ seconds. 4. Slowly lower your leg to the starting position. Let your muscles relax completely. Repeat __________ times. Complete this exercise__________ times a day. Exercise D: Hip abductors and rotators, quadruped  1. Get on your hands and knees on a firm, lightly padded surface. Your hands should be directly below your shoulders, and your knees should be directly below your hips. 2. Lift your left / right knee out to the side. Keep your knee bent. Do not twist your body. 3. Hold this position for __________ seconds. 4. Slowly lower your leg. Repeat __________ times. Complete this exercise__________ times a day. Exercise E: Straight leg raises (hip extensors) 1. Lie on your abdomen on a bed or a firm surface with a pillow under your hips. 2. Squeeze your buttock muscles and lift your left / right thigh off the bed. Do not let your back arch. 3. Hold this position for __________ seconds. 4. Slowly return to the starting position. Let your muscles relax completely before doing another repetition. Repeat __________ times. Complete this exercise__________ times a day. This information is not intended to replace advice given to you by your health care provider. Make sure you discuss any questions you have with your health care provider. Document Released: 07/08/2005 Document Revised: 03/12/2016 Document Reviewed: 06/20/2015 Elsevier Interactive Patient Education  2019 [employer]  

## 2018-10-02 ENCOUNTER — Other Ambulatory Visit: Payer: Self-pay | Admitting: Family Medicine

## 2018-10-02 MED ORDER — CYCLOBENZAPRINE HCL 5 MG PO TABS: 5.0000 mg | ORAL_TABLET | Freq: Three times a day (TID) | ORAL | 0 refills | Status: DC | PRN

## 2018-10-02 NOTE — Telephone Encounter (Signed)
Routing to provider  

## 2018-10-13 ENCOUNTER — Encounter: Payer: Self-pay | Admitting: Family Medicine

## 2018-10-14 ENCOUNTER — Telehealth: Payer: Self-pay | Admitting: Family Medicine

## 2018-10-14 NOTE — Telephone Encounter (Signed)
Called pt to let him know that physical needs to be rescheduled no answer.LMOM.

## 2018-10-15 ENCOUNTER — Encounter: Payer: BLUE CROSS/BLUE SHIELD | Admitting: Family Medicine

## 2018-10-27 ENCOUNTER — Encounter: Payer: Self-pay | Admitting: Family Medicine

## 2018-12-29 ENCOUNTER — Encounter: Payer: BLUE CROSS/BLUE SHIELD | Admitting: Family Medicine

## 2019-01-14 ENCOUNTER — Other Ambulatory Visit: Payer: Self-pay

## 2019-01-14 ENCOUNTER — Ambulatory Visit (INDEPENDENT_AMBULATORY_CARE_PROVIDER_SITE_OTHER): Payer: BC Managed Care – PPO | Admitting: Family Medicine

## 2019-01-14 ENCOUNTER — Encounter: Payer: Self-pay | Admitting: Family Medicine

## 2019-01-14 VITALS — BP 119/80 | HR 68 | Temp 98.7°F | Ht 71.0 in | Wt 312.0 lb

## 2019-01-14 DIAGNOSIS — M545 Low back pain, unspecified: Secondary | ICD-10-CM

## 2019-01-14 DIAGNOSIS — N469 Male infertility, unspecified: Secondary | ICD-10-CM

## 2019-01-14 DIAGNOSIS — Z0000 Encounter for general adult medical examination without abnormal findings: Secondary | ICD-10-CM

## 2019-01-14 DIAGNOSIS — Z23 Encounter for immunization: Secondary | ICD-10-CM

## 2019-01-14 MED ORDER — NAPROXEN 500 MG PO TABS: 500.0000 mg | ORAL_TABLET | Freq: Two times a day (BID) | ORAL | 1 refills | Status: DC

## 2019-01-14 MED ORDER — CYCLOBENZAPRINE HCL 5 MG PO TABS: 5.0000 mg | ORAL_TABLET | Freq: Every day | ORAL | 1 refills | Status: DC
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
## 2019-01-14 NOTE — Patient Instructions (Addendum)
Health Maintenance, Male A healthy lifestyle and preventive care is important for your health and wellness. Ask your health care provider about what schedule of regular examinations is right for you. What should I know about weight and diet? Eat a Healthy Diet  Eat plenty of vegetables, fruits, whole grains, low-fat dairy products, and lean protein.  Do not eat a lot of foods high in solid fats, added sugars, or salt.  Maintain a Healthy Weight Regular exercise can help you achieve or maintain a healthy weight. You should:  Do at least 150 minutes of exercise each week. The exercise should increase your heart rate and make you sweat (moderate-intensity exercise).  Do strength-training exercises at least twice a week. Watch Your Levels of Cholesterol and Blood Lipids  Have your blood tested for lipids and cholesterol every 5 years starting at 35 years of age. If you are at high risk for heart disease, you should start having your blood tested when you are 35 years old. You may need to have your cholesterol levels checked more often if: ? Your lipid or cholesterol levels are high. ? You are older than 35 years of age. ? You are at high risk for heart disease. What should I know about cancer screening? Many types of cancers can be detected early and may often be prevented. Lung Cancer  You should be screened every year for lung cancer if: ? You are a current smoker who has smoked for at least 30 years. ? You are a former smoker who has quit within the past 15 years.  Talk to your health care provider about your screening options, when you should start screening, and how often you should be screened. Colorectal Cancer  Routine colorectal cancer screening usually begins at 35 years of age and should be repeated every 5-10 years until you are 35 years old. You may need to be screened more often if early forms of precancerous polyps or small growths are found. Your health care provider may  recommend screening at an earlier age if you have risk factors for colon cancer.  Your health care provider may recommend using home test kits to check for hidden blood in the stool.  A small camera at the end of a tube can be used to examine your colon (sigmoidoscopy or colonoscopy). This checks for the earliest forms of colorectal cancer. Prostate and Testicular Cancer  Depending on your age and overall health, your health care provider may do certain tests to screen for prostate and testicular cancer.  Talk to your health care provider about any symptoms or concerns you have about testicular or prostate cancer. Skin Cancer  Check your skin from head to toe regularly.  Tell your health care provider about any new moles or changes in moles, especially if: ? There is a change in a moles size, shape, or color. ? You have a mole that is larger than a pencil eraser.  Always use sunscreen. Apply sunscreen liberally and repeat throughout the day.  Protect yourself by wearing long sleeves, pants, a wide-brimmed hat, and sunglasses when outside. What should I know about heart disease, diabetes, and high blood pressure?  If you are 83-64 years of age, have your blood pressure checked every 3-5 years. If you are 77 years of age or older, have your blood pressure checked every year. You should have your blood pressure measured twice--once when you are at a hospital or clinic, and once when you are not at a hospital  or clinic. Record the average of the two measurements. To check your blood pressure when you are not at a hospital or clinic, you can use: ? An automated blood pressure machine at a pharmacy. ? A home blood pressure monitor.  Talk to your health care provider about your target blood pressure.  If you are between 945-35 years old, ask your health care provider if you should take aspirin to prevent heart disease.  Have regular diabetes screenings by checking your fasting blood sugar  level. ? If you are at a normal weight and have a low risk for diabetes, have this test once every three years after the age of 945. ? If you are overweight and have a high risk for diabetes, consider being tested at a younger age or more often.  A one-time screening for abdominal aortic aneurysm (AAA) by ultrasound is recommended for men aged 65-75 years who are current or former smokers. What should I know about preventing infection? Hepatitis B If you have a higher risk for hepatitis B, you should be screened for this virus. Talk with your health care provider to find out if you are at risk for hepatitis B infection. Hepatitis C Blood testing is recommended for:  Everyone born from [id number] through 1967.  Anyone with known risk factors for hepatitis C. Sexually Transmitted Diseases (STDs)  You should be screened each year for STDs including gonorrhea and chlamydia if: ? You are sexually active and are younger than 35 years of age. ? You are older than 35 years of age and your health care provider tells you that you are at risk for this type of infection. ? Your sexual activity has changed since you were last screened and you are at an increased risk for chlamydia or gonorrhea. Ask your health care provider if you are at risk.  Talk with your health care provider about whether you are at high risk of being infected with HIV. Your health care provider may recommend a prescription medicine to help prevent HIV infection. What else can I do?  Schedule regular health, dental, and eye exams.  Stay current with your vaccines (immunizations).  Do not use any tobacco products, such as cigarettes, chewing tobacco, and e-cigarettes. If you need help quitting, ask your health care provider.  Limit alcohol intake to no more than 2 drinks per day. One drink equals 12 ounces of beer, 5 ounces of wine, or 1 ounces of hard liquor.  Do not use street drugs.  Do not share needles.  Ask your health  care provider for help if you need support or information about quitting drugs.  Tell your health care provider if you often feel depressed.  Tell your health care provider if you have ever been abused or do not feel safe at home. This information is not intended to replace advice given to you by your health care provider. Make sure you discuss any questions you have with your health care provider. Document Released: 01/04/2008 Document Revised: 03/06/2016 Document Reviewed: 04/11/2015 Elsevier Interactive Patient Education  2019 [employer] Tdap Vaccine (Tetanus, Diphtheria and Pertussis): What You Need to Know 1. Why get vaccinated? Tetanus, diphtheria and pertussis are very serious diseases. Tdap vaccine can protect us from these diseases. And, Tdap vaccine given to pregnant women can protect newborn babies against pertussis.Marland Kitchen. TETANUS (Lockjaw) is rare in the [place] today. It causes painful muscle tightening and stiffness, usually all over the body.  It can lead to tightening of muscles  in the head and neck so you can't open your mouth, swallow, or sometimes even breathe. Tetanus kills about 1 out of 10 people who are infected even after receiving the best medical care. DIPHTHERIA is also rare in the [place] today. It can cause a thick coating to form in the back of the throat.  It can lead to breathing problems, heart failure, paralysis, and death. PERTUSSIS (Whooping Cough) causes severe coughing spells, which can cause difficulty breathing, vomiting and disturbed sleep.  It can also lead to weight loss, incontinence, and rib fractures. Up to 2 in 100 adolescents and 5 in 100 adults with pertussis are hospitalized or have complications, which could include pneumonia or death. These diseases are caused by bacteria. Diphtheria and pertussis are spread from person to person through secretions from coughing or sneezing. Tetanus enters the body through cuts, scratches, or  wounds. Before vaccines, as many as 200,000 cases of diphtheria, 200,000 cases of pertussis, and hundreds of cases of tetanus, were reported in the [place] each year. Since vaccination began, reports of cases for tetanus and diphtheria have dropped by about 99% and for pertussis by about 80%. 2. Tdap vaccine Tdap vaccine can protect adolescents and adults from tetanus, diphtheria, and pertussis. One dose of Tdap is routinely given at age 63 or 56. People who did not get Tdap at that age should get it as soon as possible. Tdap is especially important for healthcare professionals and anyone having close contact with a baby younger than 12 months. Pregnant women should get a dose of Tdap during every pregnancy, to protect the newborn from pertussis. Infants are most at risk for severe, life-threatening complications from pertussis. Another vaccine, called Td, protects against tetanus and diphtheria, but not pertussis. A Td booster should be given every 10 years. Tdap may be given as one of these boosters if you have never gotten Tdap before. Tdap may also be given after a severe cut or burn to prevent tetanus infection. Your doctor or the person giving you the vaccine can give you more information. Tdap may safely be given at the same time as other vaccines. 3. Some people should not get this vaccine  A person who has ever had a life-threatening allergic reaction after a previous dose of any diphtheria, tetanus or pertussis containing vaccine, OR has a severe allergy to any part of this vaccine, should not get Tdap vaccine. Tell the person giving the vaccine about any severe allergies.  Anyone who had coma or long repeated seizures within 7 days after a childhood dose of DTP or DTaP, or a previous dose of Tdap, should not get Tdap, unless a cause other than the vaccine was found. They can still get Td.  Talk to your doctor if you: ? have seizures or another nervous system problem, ? had severe  pain or swelling after any vaccine containing diphtheria, tetanus or pertussis, ? ever had a condition called Guillain-Barr Syndrome (GBS), ? aren't feeling well on the day the shot is scheduled. 4. Risks With any medicine, including vaccines, there is a chance of side effects. These are usually mild and go away on their own. Serious reactions are also possible but are rare. Most people who get Tdap vaccine do not have any problems with it. Mild problems following Tdap (Did not interfere with activities)  Pain where the shot was given (about 3 in 4 adolescents or 2 in 3 adults)  Redness or swelling where the shot was given (about  1 person in 5)  Mild fever of at least 100.17F (up to about 1 in 25 adolescents or 1 in 100 adults)  Headache (about 3 or 4 people in 10)  Tiredness (about 1 person in 3 or 4)  Nausea, vomiting, diarrhea, stomach ache (up to 1 in 4 adolescents or 1 in 10 adults)  Chills, sore joints (about 1 person in 10)  Body aches (about 1 person in 3 or 4)  Rash, swollen glands (uncommon) Moderate problems following Tdap (Interfered with activities, but did not require medical attention)  Pain where the shot was given (up to 1 in 5 or 6)  Redness or swelling where the shot was given (up to about 1 in 16 adolescents or 1 in 12 adults)  Fever over 102F (about 1 in 100 adolescents or 1 in 250 adults)  Headache (about 1 in 7 adolescents or 1 in 10 adults)  Nausea, vomiting, diarrhea, stomach ache (up to 1 or 3 people in 100)  Swelling of the entire arm where the shot was given (up to about 1 in 500). Severe problems following Tdap (Unable to perform usual activities; required medical attention)  Swelling, severe pain, bleeding and redness in the arm where the shot was given (rare). Problems that could happen after any vaccine:  People sometimes faint after a medical procedure, including vaccination. Sitting or lying down for about 15 minutes can help prevent  fainting, and injuries caused by a fall. Tell your doctor if you feel dizzy, or have vision changes or ringing in the ears.  Some people get severe pain in the shoulder and have difficulty moving the arm where a shot was given. This happens very rarely.  Any medication can cause a severe allergic reaction. Such reactions from a vaccine are very rare, estimated at fewer than 1 in a million doses, and would happen within a few minutes to a few hours after the vaccination. As with any medicine, there is a very remote chance of a vaccine causing a serious injury or death. The safety of vaccines is always being monitored. For more information, visit: http://floyd.org/www.cdc.gov/vaccinesafety/ 5. What if there is a serious problem? What should I look for?  Look for anything that concerns you, such as signs of a severe allergic reaction, very high fever, or unusual behavior. Signs of a severe allergic reaction can include hives, swelling of the face and throat, difficulty breathing, a fast heartbeat, dizziness, and weakness. These would usually start a few minutes to a few hours after the vaccination. What should I do?  If you think it is a severe allergic reaction or other emergency that can't wait, call 9-1-1 or get the person to the nearest hospital. Otherwise, call your doctor.  Afterward, the reaction should be reported to the Vaccine Adverse Event Reporting System (VAERS). Your doctor might file this report, or you can do it yourself through the VAERS web site at www.vaers.LAgents.nohhs.gov, or by calling 1-539-047-2923. VAERS does not give medical advice. 6. The National Vaccine Injury Compensation Program The [employer] Vaccine Injury Compensation Program (VICP) is a federal program that was created to compensate people who may have been injured by certain vaccines. Persons who believe they may have been injured by a vaccine can learn about the program and about filing a claim by calling 1-281-834-0836 or visiting the VICP  website at SpiritualWord.atwww.hrsa.gov/vaccinecompensation. There is a time limit to file a claim for compensation. 7. How can I learn more?  Ask your doctor. He or she  can give you the vaccine package insert or suggest other sources of information.  Call your local or state health department.  Contact the Centers for Disease Control and Prevention (CDC): ? Call 682-402-8310 (1-800-CDC-INFO) or ? Visit CDC's website at http://hunter.com/ Vaccine Information Statement Tdap Vaccine (09/14/2013) This information is not intended to replace advice given to you by your health care provider. Make sure you discuss any questions you have with your health care provider. Document Released: 01/07/2012 Document Revised: 02/23/2018 Document Reviewed: 02/23/2018 Elsevier Interactive Patient Education  2019 [employer].  Male Infertility  Male infertility refers to a man's inability to get a woman pregnant (get her to conceive) after a year of having sex regularly without using birth control. Both men and women can have fertility problems. What are the causes? This condition may be caused by:  Problems with sperm. Infertility can result if a man is: ? Not producing enough sperm (low sperm count). ? Not producing enough sperm of normal size and shape (poor sperm morphology). ? Producing sperm that are not able to reach the egg (poor motility).  Problems in a man's reproductive organs, such as: ? Enlarged veins (varicoceles), cysts (spermatoceles), or tumors of the testicles. ? Sexual dysfunction, including inability to have an erection. ? Injury to the testicles. ? Having had a testicle that did not drop to its location in the scrotum (undescended testicle). ? A birth defect, such as not having the tubes that carry sperm (vas deferens). ? Lack of certain hormones.  Certain medical conditions. These may include: ? Diabetes. ? Cancer and cancer treatments, such as chemotherapy or radiation. ? Klinefelter  syndrome. This is an inherited genetic disorder. ? Thyroid problems, such as an underactive or overactive thyroid. ? Cystic fibrosis. ? Infections. ? Sexually transmitted diseases. Infertility can be linked to more than one cause. The cause of infertility in some men is not known (unexplained infertility). What increases the risk?  Age. A man's fertility declines with age.  Smoking tobacco.  Excessive alcohol use.  Obesity.  Emotional stress.  Exposure of heat to the testicles, such as frequent use of a hot tub or sauna.  Using drugs such as anabolic steroids, cocaine, and marijuana.  Being exposed to environmental toxins, such as pesticides and lead. What are the signs or symptoms? The main sign of infertility in men is the inability to get a woman to conceive. How is this diagnosed? This condition may be diagnosed using:  Semen tests to check sperm count, morphology, and motility.  Blood tests to check hormone levels.  Ultrasound of the scrotum to check for a varicocele or problems with the testicles.  Transrectal ultrasound to check the prostate gland and to look for problems with the tubes that transport the semen (seminal vesicles).  Taking a small sample of tissue from inside a testicle (biopsy). This is examined under a microscope.  Blood tests to check for genetic abnormalities (genetic testing). To be diagnosed with infertility, both partners will have a physical exam. Both partners will also have an extensive medical and sexual history taken. Additional tests may be done. How is this treated? Treatment depends on the cause of infertility. Most cases of infertility in men are treated with medicine, surgery, or lifestyle changes.  Men may take medicine to: ? Correct hormone problems. ? Treat other health conditions. ? Treat infections. ? Treat sexual dysfunction.  Surgery may be done to: ? Remove blockages in the reproductive tract. ? Correct other  structural problems of  the reproductive tract.  Lifestyle changes, such as: ? Reducing or not drinking alcohol. ? Stopping use of drugs such as anabolic steroids, cocaine, and marijuana. ? Losing weight. ? Stopping smoking. ? Using stress reduction techniques. Assisted reproductive technology (ART) Assisted reproductive technology (ART) refers to all treatments and procedures that combine eggs and sperm outside the body to try to help a couple conceive. Sperm is collected through normal ejaculation or surgery, or from a donor. ART is often combined with fertility drugs to stimulate ovulation in the woman. Sometimes ART is done using eggs retrieved from another woman's body (donor eggs) or from previously frozen fertilized eggs (embryos). There are different types of ART. These include:  Intrauterine insemination (IUI). A long, thin tube is used to place sperm directly into a woman's uterus. This procedure: ? Is effective for infertility caused by sperm problems, including low sperm count and low motility. ? Can be used in combination with fertility drugs.  In vitro fertilization (IVF). This is done when a woman's fallopian tubes are blocked or when a man has low sperm count. In this procedure: ? Fertility drugs are used to stimulate the ovaries to produce multiple eggs. ? Once mature, these eggs are removed from the body and combined with the sperm to be fertilized. ? The fertilized eggs are then placed into the woman's uterus. Follow these instructions at home:  Take over-the-counter and prescription medicines only as told by your health care provider.  Do not use any products that contain nicotine or tobacco, such as cigarettes and e-cigarettes. If you need help quitting, ask your health care provider.  If you drink alcohol, limit how much you have to 0-2 drinks a day.  Make dietary changes to lose weight or maintain a healthy weight. Work with your health care provider and a dietitian  to set a weight-loss goal that is healthy and reasonable for you.  Seek support from a counselor or support group to talk about your concerns related to infertility. Couples counseling may be helpful for you and your partner.  Practice stress reduction techniques that work well for you, such as regular physical activity, meditation, or deep breathing.  Keep all follow-up visits as told by your health care provider. This is important. Contact a health care provider if you:  Feel that stress is interfering with your life and relationships.  Have side effects from treatments for infertility. Summary  Male infertility refers to a man's inability to get a woman pregnant (get her to conceive) after a year of having sex regularly without using birth control.  To be diagnosed with infertility, both partners will have a physical exam. Both partners will also have an extensive medical and sexual history taken.  Seek support from a counselor or support group to talk about your concerns related to infertility. Couples counseling may be helpful for you and your partner. This information is not intended to replace advice given to you by your health care provider. Make sure you discuss any questions you have with your health care provider. Document Released: 06/09/2017 Document Revised: 06/09/2017 Document Reviewed: 06/09/2017 Elsevier Interactive Patient Education  2019 [employer]

## 2019-01-14 NOTE — Assessment & Plan Note (Signed)
Congratulated patient on further weight loss. Continue diet and exercise with goal of losing 1-2lbs per week. Call with any concerns. Continue to monitor.

## 2019-01-15 LAB — LIPID PANEL W/O CHOL/HDL RATIO
Cholesterol, Total: 175 mg/dL (ref 100–199)
HDL: 50 mg/dL (ref >39)
LDL Calculated: 110 mg/dL — ABNORMAL HIGH (ref 0–99)
Triglycerides: 73 mg/dL (ref 0–149)
VLDL Cholesterol Cal: 15 mg/dL (ref 5–40)

## 2019-01-15 LAB — COMPREHENSIVE METABOLIC PANEL
ALT: 30 IU/L (ref 0–44)
AST: 21 IU/L (ref 0–40)
Albumin/Globulin Ratio: 1.8 (ref 1.2–2.2)
Albumin: 4.5 g/dL (ref 4.0–5.0)
Alkaline Phosphatase: 87 IU/L (ref 39–117)
BUN/Creatinine Ratio: 19 (ref 9–20)
BUN: 14 mg/dL (ref 6–20)
Bilirubin Total: 0.4 mg/dL (ref 0.0–1.2)
CO2: 20 mmol/L (ref 20–29)
Calcium: 9.2 mg/dL (ref 8.7–10.2)
Chloride: 101 mmol/L (ref 96–106)
Creatinine, Ser: 0.74 mg/dL — ABNORMAL LOW (ref 0.76–1.27)
GFR calc Af Amer: 138 mL/min/{1.73_m2} (ref >59)
GFR calc non Af Amer: 120 mL/min/{1.73_m2} (ref >59)
Globulin, Total: 2.5 g/dL (ref 1.5–4.5)
Glucose: 79 mg/dL (ref 65–99)
Potassium: 4.1 mmol/L (ref 3.5–5.2)
Sodium: 138 mmol/L (ref 134–144)
Total Protein: 7 g/dL (ref 6.0–8.5)

## 2019-01-15 LAB — CBC WITH DIFFERENTIAL/PLATELET
Basophils Absolute: 0 10*3/uL (ref 0.0–0.2)
Basos: 1 %
EOS (ABSOLUTE): 0.2 10*3/uL (ref 0.0–0.4)
Eos: 3 %
Hematocrit: 42.3 % (ref 37.5–51.0)
Hemoglobin: 14.7 g/dL (ref 13.0–17.7)
Immature Grans (Abs): 0 10*3/uL (ref 0.0–0.1)
Immature Granulocytes: 0 %
Lymphocytes Absolute: 2.3 10*3/uL (ref 0.7–3.1)
Lymphs: 36 %
MCH: 29.7 pg (ref 26.6–33.0)
MCHC: 34.8 g/dL (ref 31.5–35.7)
MCV: 86 fL (ref 79–97)
Monocytes Absolute: 0.3 10*3/uL (ref 0.1–0.9)
Monocytes: 5 %
Neutrophils Absolute: 3.6 10*3/uL (ref 1.4–7.0)
Neutrophils: 55 %
Platelets: 218 10*3/uL (ref 150–450)
RBC: 4.95 x10E6/uL (ref 4.14–5.80)
RDW: 13.7 % (ref 11.6–15.4)
WBC: 6.5 10*3/uL (ref 3.4–10.8)

## 2019-01-15 LAB — TSH: TSH: 1.91 u[IU]/mL (ref 0.450–4.500)

## 2019-01-16 LAB — UA/M W/RFLX CULTURE, ROUTINE
Bilirubin, UA: NEGATIVE
Glucose, UA: NEGATIVE
Ketones, UA: NEGATIVE
Nitrite, UA: NEGATIVE
Protein,UA: NEGATIVE
RBC, UA: NEGATIVE
Specific Gravity, UA: 1.02 (ref 1.005–1.030)
Urobilinogen, Ur: 0.2 mg/dL (ref 0.2–1.0)
pH, UA: 5 (ref 5.0–7.5)

## 2019-01-16 LAB — MICROSCOPIC EXAMINATION: RBC, Urine: NONE SEEN /hpf (ref 0–2)

## 2019-01-16 LAB — URINE CULTURE, REFLEX

## 2019-01-16 LAB — BAYER DCA HB A1C WAIVED: HB A1C (BAYER DCA - WAIVED): 5.4 % (ref <7.0)

## 2019-02-03 DIAGNOSIS — E291 Testicular hypofunction: Secondary | ICD-10-CM | POA: Diagnosis not present

## 2019-02-24 DIAGNOSIS — E291 Testicular hypofunction: Secondary | ICD-10-CM | POA: Diagnosis not present

## 2019-02-24 DIAGNOSIS — Z3141 Encounter for fertility testing: Secondary | ICD-10-CM | POA: Diagnosis not present

## 2019-03-11 DIAGNOSIS — E291 Testicular hypofunction: Secondary | ICD-10-CM | POA: Diagnosis not present

## 2019-03-11 DIAGNOSIS — Z3184 Encounter for fertility preservation procedure: Secondary | ICD-10-CM | POA: Diagnosis not present

## 2019-03-11 DIAGNOSIS — Z3141 Encounter for fertility testing: Secondary | ICD-10-CM | POA: Diagnosis not present

## 2019-03-11 DIAGNOSIS — Z113 Encounter for screening for infections with a predominantly sexual mode of transmission: Secondary | ICD-10-CM | POA: Diagnosis not present

## 2019-03-24 DIAGNOSIS — Z3141 Encounter for fertility testing: Secondary | ICD-10-CM | POA: Diagnosis not present

## 2019-03-30 DIAGNOSIS — E291 Testicular hypofunction: Secondary | ICD-10-CM | POA: Diagnosis not present

## 2019-10-11 ENCOUNTER — Emergency Department
Admission: EM | Admit: 2019-10-11 | Discharge: 2019-10-11 | Disposition: A | Discharge status: Home / Self Care | Payer: BC Managed Care – PPO | Attending: Emergency Medicine | Admitting: Emergency Medicine

## 2019-10-11 ENCOUNTER — Other Ambulatory Visit: Payer: Self-pay

## 2019-10-11 ENCOUNTER — Emergency Department: Payer: BC Managed Care – PPO

## 2019-10-11 DIAGNOSIS — K122 Cellulitis and abscess of mouth: Secondary | ICD-10-CM | POA: Diagnosis not present

## 2019-10-11 DIAGNOSIS — Z79899 Other long term (current) drug therapy: Secondary | ICD-10-CM | POA: Insufficient documentation

## 2019-10-11 DIAGNOSIS — R221 Localized swelling, mass and lump, neck: Secondary | ICD-10-CM | POA: Diagnosis not present

## 2019-10-11 DIAGNOSIS — Z87891 Personal history of nicotine dependence: Secondary | ICD-10-CM | POA: Diagnosis not present

## 2019-10-11 LAB — CBC WITH DIFFERENTIAL/PLATELET
Abs Immature Granulocytes: 0.03 10*3/uL (ref 0.00–0.07)
Basophils Absolute: 0 10*3/uL (ref 0.0–0.1)
Basophils Relative: 0 %
Eosinophils Absolute: 0.2 10*3/uL (ref 0.0–0.5)
Eosinophils Relative: 2 %
HCT: 41.1 % (ref 39.0–52.0)
Hemoglobin: 13.7 g/dL (ref 13.0–17.0)
Immature Granulocytes: 0 %
Lymphocytes Relative: 24 %
Lymphs Abs: 2.3 10*3/uL (ref 0.7–4.0)
MCH: 28.6 pg (ref 26.0–34.0)
MCHC: 33.3 g/dL (ref 30.0–36.0)
MCV: 85.8 fL (ref 80.0–100.0)
Monocytes Absolute: 0.6 10*3/uL (ref 0.1–1.0)
Monocytes Relative: 7 %
Neutro Abs: 6.2 10*3/uL (ref 1.7–7.7)
Neutrophils Relative %: 67 %
Platelets: 246 10*3/uL (ref 150–400)
RBC: 4.79 MIL/uL (ref 4.22–5.81)
RDW: 12.6 % (ref 11.5–15.5)
WBC: 9.4 10*3/uL (ref 4.0–10.5)
nRBC: 0 % (ref 0.0–0.2)

## 2019-10-11 LAB — BASIC METABOLIC PANEL
Anion gap: 9 (ref 5–15)
BUN: 10 mg/dL (ref 6–20)
CO2: 26 mmol/L (ref 22–32)
Calcium: 9.2 mg/dL (ref 8.9–10.3)
Chloride: 104 mmol/L (ref 98–111)
Creatinine, Ser: 0.68 mg/dL (ref 0.61–1.24)
GFR calc Af Amer: >60 mL/min (ref >60)
GFR calc non Af Amer: >60 mL/min (ref >60)
Glucose, Bld: 87 mg/dL (ref 70–99)
Potassium: 3.9 mmol/L (ref 3.5–5.1)
Sodium: 139 mmol/L (ref 135–145)

## 2019-10-11 IMAGING — CT CT MAXILLOFACIAL W/ CM
3 series · 14 of 47 positions shown, 16 images · IV contrast (omnipaque)
Comparison: None.

CLINICAL DATA: 33-year-old male with left mouth abscess x1 week but
not improving on antibiotics.

EXAM:
CT MAXILLOFACIAL WITH CONTRAST
TECHNIQUE: Multidetector CT imaging of the maxillofacial structures was
performed with intravenous contrast. Multiplanar CT image
reconstructions were also generated.
CONTRAST:  75mL OMNIPAQUE IOHEXOL 300 MG/ML  SOLN

[Series 2: max soft · axial · 0.43mm/px · z∈[+106,+276]mm · 8 of 99 slices shown, 10 images]
[im 7/99  brain]
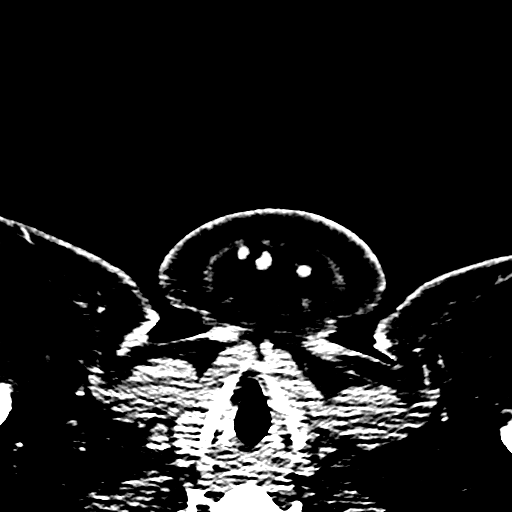
[im 7/99  bone]
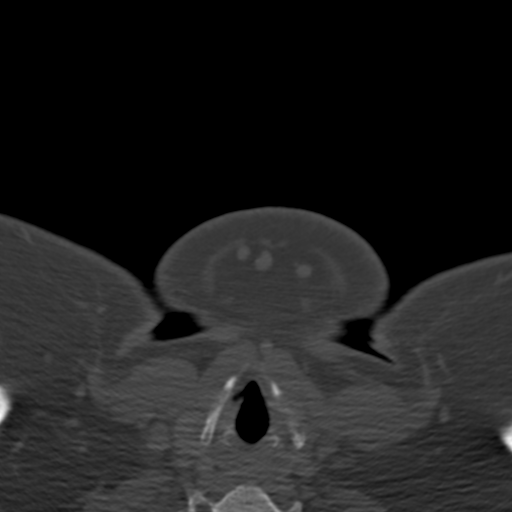
[im 21/99  bone]
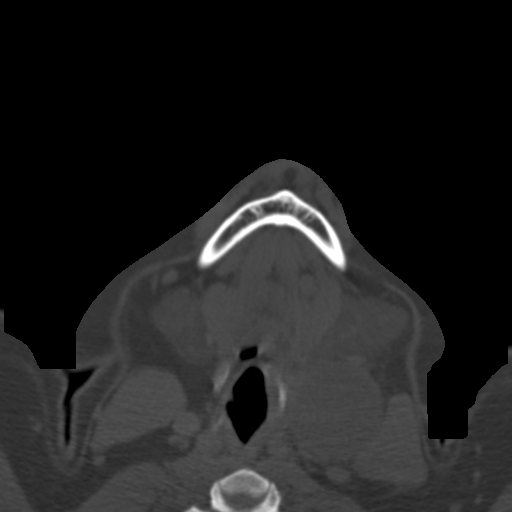
[im 31/99  bone]
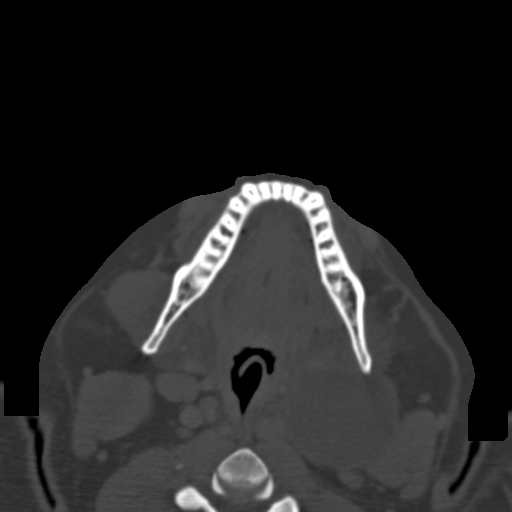
[im 44/99  bone]
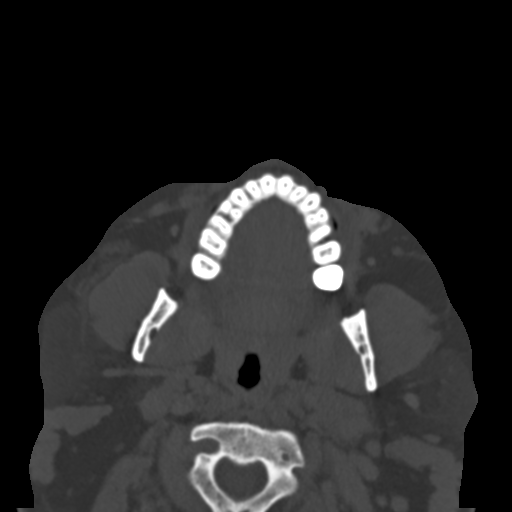
[im 55/99  brain]
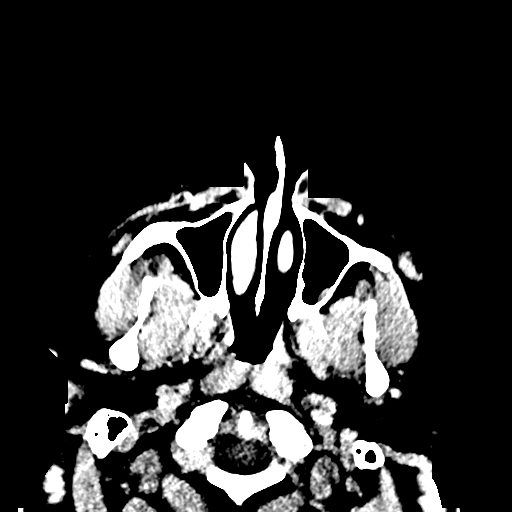
[im 55/99  bone]
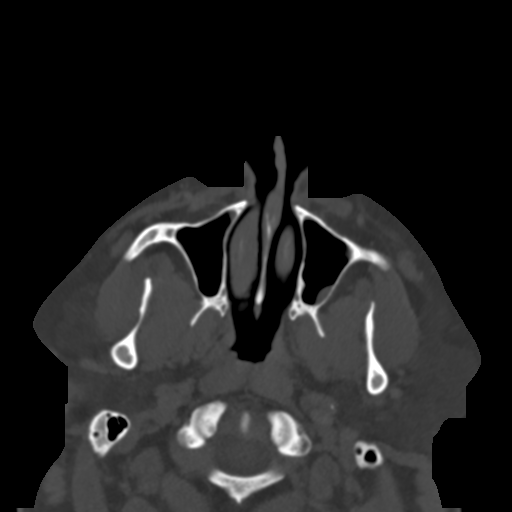
[im 68/99  bone]
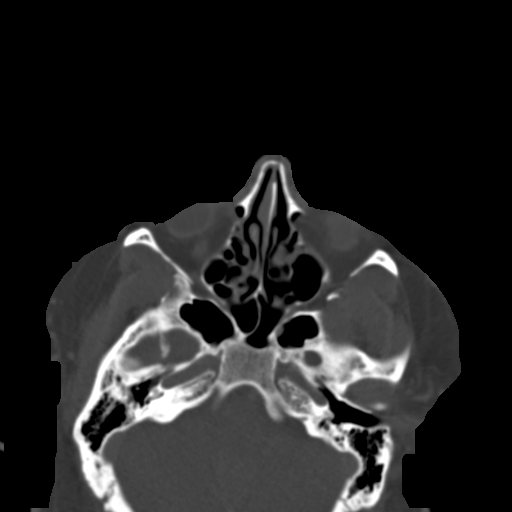
[im 78/99  bone]
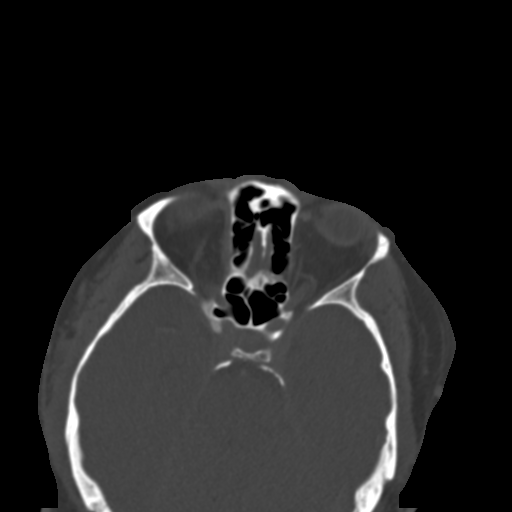
[im 92/99  bone]
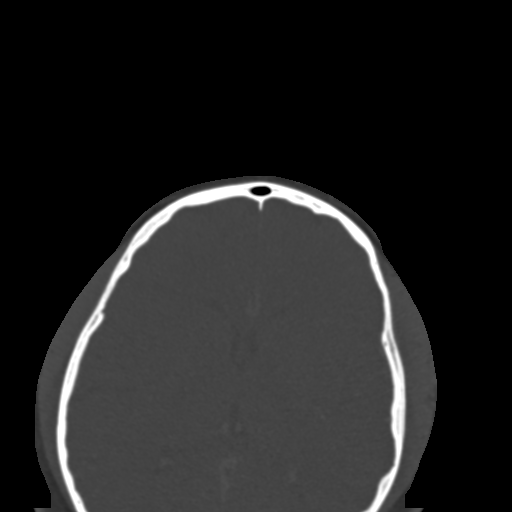

[Series 6: coronal soft · coronal · 0.45mm/px · 3 of 71 slices shown]
[im 24/71  bone]
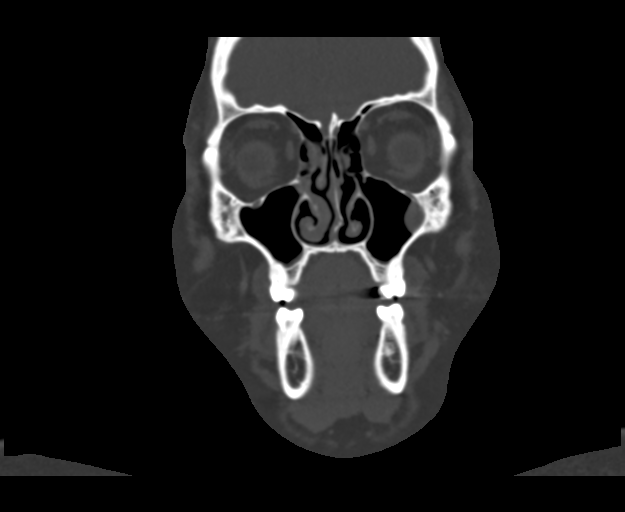
[im 32/71  bone]
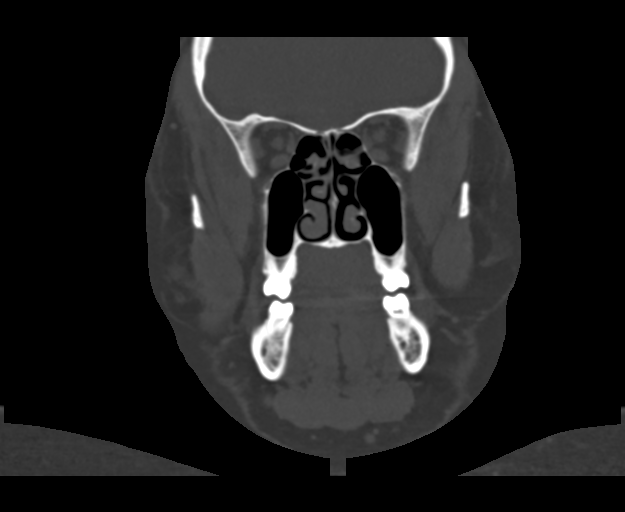
[im 39/71  bone]
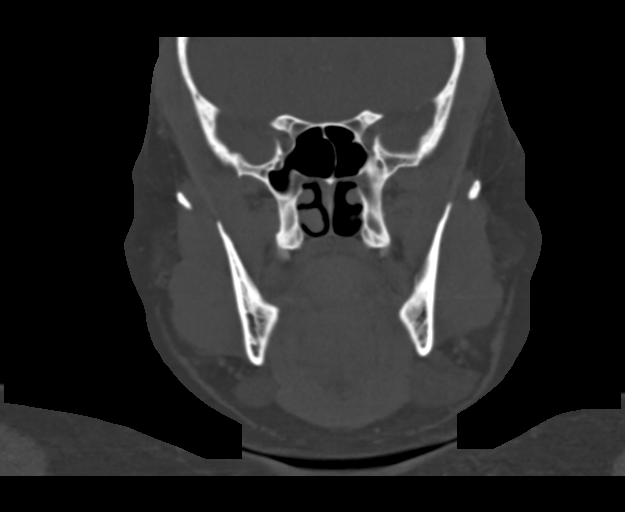

[Series 8: sagittal soft · sagittal · 0.28mm/px · 3 of 105 slices shown]
[im 35/105  bone]
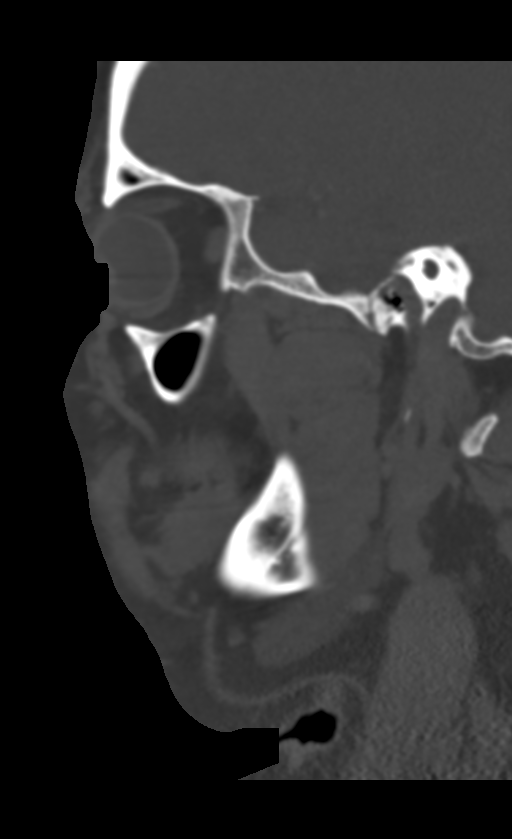
[im 53/105  bone]
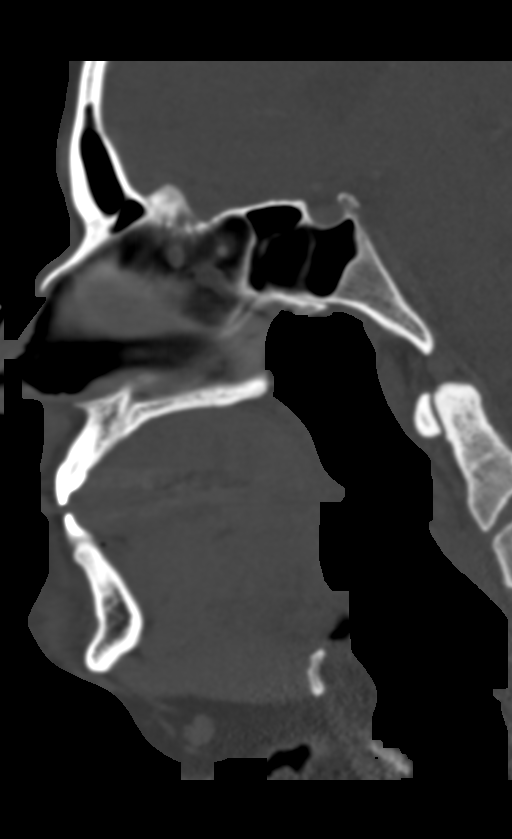
[im 70/105  bone]
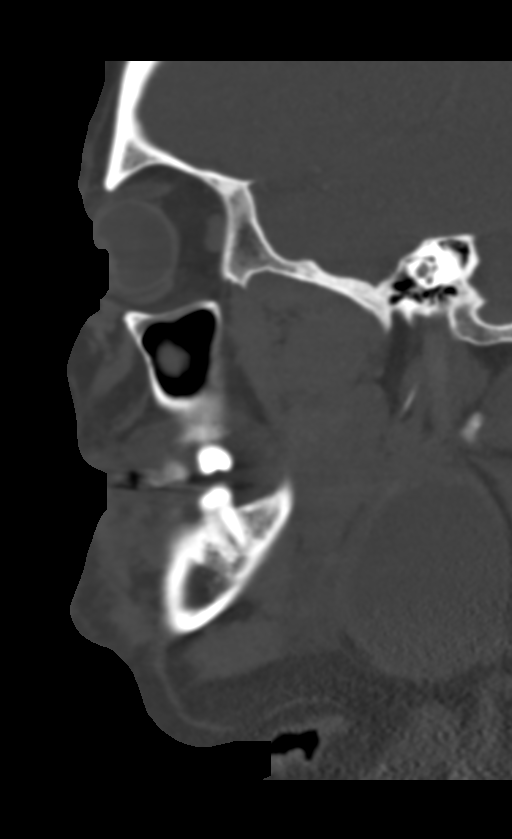

[14 of 47 positions shown; findings below may reference images not displayed]

FINDINGS: Osseous: The mandible is intact, and no significant dental
abnormality is identified.

Other facial bones, visible skull base, upper cervical vertebrae,
and calvarium appear intact.

Orbits: Intact orbital walls. Orbits soft tissues appears symmetric
and normal.

Sinuses: Scattered generally mild paranasal sinus mucosal thickening
in the maxillary and ethmoid air cells. Hyperplastic sphenoid
sinuses including right clinoid process pneumatization. Sphenoid and
frontal sinuses are clear. Tympanic cavities and mastoids are clear.

Soft tissues: Negative visible thyroid. Laryngeal and pharyngeal
soft tissue contours are within normal limits. No oral cavity
abnormality is evident. Negative retropharyngeal space aside from a
partially retropharyngeal course of both carotids.

There is a large cystic mass of the left neck which is lateral to
the left carotid space (left carotid and jugular deviated medial on
series 2, image 73) and along the anteromedial surface of the
sternocleidomastoid muscle (image 74). The lesion appears
predominantly cystic with simple fluid density, although there is a
6 mm soft tissue rind along the superior margin (coronal image 61).
The mass is 49 x 45 x 64 mm (AP by transverse by CC).

There is no rim enhancement or surrounding inflammatory stranding.

No surrounding cervical lymphadenopathy, and elsewhere the cervical
nodal stations are within normal limits.

Sublingual spaces negative. Submandibular glands appear negative
aside from mass effect on the left. Parotid glands appear negative.

Limited intracranial: Grossly patent major vascular structures at
the skull base. The right vertebral artery appears dominant.
Negative visible brain parenchyma.
IMPRESSION: 1. Large round predominantly cystic or necrotic mass in the left
neck measures up to 64 mm diameter (coronal image 61). There does
appear to be a thin rim of soft tissue but no rim enhancement or
regional inflammation.
The top differential considerations are a large 2nd branchial cleft
cyst (favored in this age group) versus a necrotic/malignant lymph
node (such as from an occult head and neck squamous cell carcinoma).

2. No oral cavity abnormality.  No acute dental finding identified.

3. Mild ethmoid and maxillary sinus disease.

## 2019-10-11 MED ORDER — IOHEXOL 300 MG/ML  SOLN
75.0000 mL | Freq: Once | INTRAMUSCULAR | Status: AC | PRN
  Administered 2019-10-11: 75 mL via INTRAVENOUS
  Filled 2019-10-11: qty 75

## 2019-10-11 NOTE — Discharge Instructions (Addendum)
Contact a health care provider if: You have redness, swelling, or pain around your cyst. Your cyst feels warm to the touch. Your cyst gets bigger. You have difficulty swallowing. Get help right away if: You have difficulty breathing. You cannot swallow. You have severe pain. 

## 2019-10-11 NOTE — ED Notes (Signed)
Pt has an abcess on left lower jaw that was treated with antibiotics at urgent care on Friday. UC told pt to go to ED if it got worse. Pt states abcess causing tightness in throat, "like sore throat with swallowing"

## 2019-10-11 NOTE — ED Notes (Signed)
Pt trx to CT.  

## 2019-10-11 NOTE — ED Provider Notes (Addendum)
MSE was initiated and I personally evaluated the patient and placed orders (if any) at  6:03 PM on October 11, 2019.  HPI: Facial Swelling  S: presents with worsening swelling to the left lower jaw. No better after 3 days of Augmentin, started by local [facility]. No FCS, NV, or chest pain. No oral swelling. Now causing pain with a swallowing.   O: local soft tissue swelling and fullness to the angle of the lower left jaw. No oral lesions or dental decay.   A/P:  Facial/Jaw Swelling concerning for abscess/lymph node/salivary gland infection.   Initial labs and imaging are pending.   The patient appears stable so that the remainder of the MSE may be completed by another provider.   Lissa Hoard, PA-C 10/11/19 1807    Lissa Hoard, PA-C 10/11/19 1815    Miguel Aschoff., MD 10/12/19 [phone]

## 2019-10-11 NOTE — ED Triage Notes (Addendum)
Pt comes via POV from home with c/o abscess to left sided of inner mouth. Pt states this started a week ago.  Pt states he went to urgent care and was prescribed antibiotic. Pt states he was advised to come here if not better.  Pt states tenderness to left side. Pt denies any drainage
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
## 2019-10-12 ENCOUNTER — Ambulatory Visit: Payer: BC Managed Care – PPO | Admitting: Nurse Practitioner

## 2019-10-12 DIAGNOSIS — R221 Localized swelling, mass and lump, neck: Secondary | ICD-10-CM | POA: Diagnosis not present

## 2019-10-12 DIAGNOSIS — Q180 Sinus, fistula and cyst of branchial cleft: Secondary | ICD-10-CM | POA: Diagnosis not present

## 2019-10-15 DIAGNOSIS — Q180 Sinus, fistula and cyst of branchial cleft: Secondary | ICD-10-CM | POA: Diagnosis not present

## 2019-10-26 ENCOUNTER — Other Ambulatory Visit: Payer: Self-pay

## 2019-10-26 ENCOUNTER — Encounter
Admission: RE | Admit: 2019-10-26 | Discharge: 2019-10-26 | Disposition: A | Discharge status: Home / Self Care | Payer: BC Managed Care – PPO | Source: Ambulatory Visit | Attending: Otolaryngology | Admitting: Otolaryngology

## 2019-10-26 HISTORY — DX: Other complications of anesthesia, initial encounter: T88.59XA

## 2019-10-26 NOTE — Patient Instructions (Signed)
Your procedure is scheduled on: 11-01-19 [facility] Report to Same Day Surgery 2nd floor medical mall [facility] Entrance-take elevator on left to 2nd floor.  Check in with surgery information desk.) To find out your arrival time please call [phone] between 1PM - 3PM on 10-29-19 FRIDAY  Remember: Instructions that are not followed completely may result in serious medical risk, up to and including death, or upon the discretion of your surgeon and anesthesiologist your surgery may need to be rescheduled.    _x___ 1. Do not eat food after midnight the night before your procedure. NO GUM OR CANDY AFTER MIDNIGHT. You may drink clear liquids up to 2 hours before you are scheduled to arrive at the hospital for your procedure.  Do not drink clear liquids within 2 hours of your scheduled arrival to the hospital.  Clear liquids include  --Water or Apple juice without pulp  --Gatorade  --Black Coffee or Clear Tea (No milk, no creamers, do not add anything to the coffee or Tea   ____Ensure clear carbohydrate drink on the way to the hospital for bariatric patients  ____Ensure clear carbohydrate drink 3 hours before surgery.    __x__ 2. No Alcohol for 24 hours before or after surgery.   __x__3. No Smoking or e-cigarettes for 24 prior to surgery.  Do not use any chewable tobacco products for at least 6 hour prior to surgery   ____  4. Bring all medications with you on the day of surgery if instructed.    __x__ 5. Notify your doctor if there is any change in your medical condition     (cold, fever, infections).    x___6. On the morning of surgery brush your teeth with toothpaste and water.  You may rinse your mouth with mouth wash if you wish.  Do not swallow any toothpaste or mouthwash.   Do not wear jewelry, make-up, hairpins, clips or nail polish.  Do not wear lotions, powders, or perfumes.   Do not shave 48 hours prior to surgery. Men may shave face and neck.  Do not bring valuables to the  hospital.    [facility] is not responsible for any belongings or valuables.               Contacts, dentures or bridgework may not be worn into surgery.  Leave your suitcase in the car. After surgery it may be brought to your room.  For patients admitted to the hospital, discharge time is determined by your treatment team.  _  Patients discharged the day of surgery will not be allowed to drive home.  You will need someone to drive you home and stay with you the night of your procedure.    Please read over the following fact sheets that you were given:   [facility] Preparing for Surgery  ____ Take anti-hypertensive listed below, cardiac, seizure, asthma, anti-reflux and psychiatric medicines. These include:  1. NONE  2.  3.  4.  5.  6.  ____Fleets enema or Magnesium Citrate as directed.   _x___ Use CHG Soap or sage wipes as directed on instruction sheet   ____ Use inhalers on the day of surgery and bring to hospital day of surgery  ____ Stop Metformin and Janumet 2 days prior to surgery.    ____ Take 1/2 of usual insulin dose the night before surgery and none on the morning surgery.   ____ Follow recommendations from Cardiologist, Pulmonologist or PCP regarding stopping Aspirin, Coumadin, Plavix ,Eliquis,  Effient, or Pradaxa, and Pletal.  X____Stop Anti-inflammatories such as Advil, Aleve, Ibuprofen, Motrin, Naproxen, Naprosyn, Goodies powders or aspirin products NOW-OK to take Tylenol    ____ Stop supplements until after surgery.     ____ Bring C-Pap to the hospital.

## 2019-10-28 ENCOUNTER — Other Ambulatory Visit: Payer: Self-pay

## 2019-10-28 ENCOUNTER — Other Ambulatory Visit
Admission: RE | Admit: 2019-10-28 | Discharge: 2019-10-28 | Disposition: A | Discharge status: Home / Self Care | Payer: BC Managed Care – PPO | Source: Ambulatory Visit | Attending: Otolaryngology | Admitting: Otolaryngology

## 2019-10-28 DIAGNOSIS — Z20822 Contact with and (suspected) exposure to covid-19: Secondary | ICD-10-CM | POA: Insufficient documentation

## 2019-10-28 DIAGNOSIS — Z01812 Encounter for preprocedural laboratory examination: Secondary | ICD-10-CM | POA: Diagnosis not present

## 2019-10-28 LAB — SARS CORONAVIRUS 2 (TAT 6-24 HRS): SARS Coronavirus 2: NEGATIVE

## 2019-11-01 ENCOUNTER — Other Ambulatory Visit: Payer: Self-pay

## 2019-11-01 ENCOUNTER — Ambulatory Visit: Payer: BC Managed Care – PPO | Admitting: Anesthesiology

## 2019-11-01 ENCOUNTER — Encounter
Admission: RE | Disposition: A | Discharge status: Home / Self Care | Payer: Self-pay | Source: Home / Self Care | Attending: Otolaryngology

## 2019-11-01 ENCOUNTER — Ambulatory Visit
Admission: RE | Admit: 2019-11-01 | Discharge: 2019-11-01 | Disposition: A | Discharge status: Home / Self Care | Payer: BC Managed Care – PPO | Attending: Otolaryngology | Admitting: Otolaryngology

## 2019-11-01 ENCOUNTER — Encounter: Payer: Self-pay | Admitting: Otolaryngology

## 2019-11-01 DIAGNOSIS — Z6841 Body Mass Index (BMI) 40.0 and over, adult: Secondary | ICD-10-CM | POA: Diagnosis not present

## 2019-11-01 DIAGNOSIS — Z87891 Personal history of nicotine dependence: Secondary | ICD-10-CM | POA: Insufficient documentation

## 2019-11-01 DIAGNOSIS — Q180 Sinus, fistula and cyst of branchial cleft: Secondary | ICD-10-CM | POA: Diagnosis not present

## 2019-11-01 DIAGNOSIS — R221 Localized swelling, mass and lump, neck: Secondary | ICD-10-CM | POA: Diagnosis not present

## 2019-11-01 DIAGNOSIS — M1990 Unspecified osteoarthritis, unspecified site: Secondary | ICD-10-CM | POA: Insufficient documentation

## 2019-11-01 HISTORY — PX: EAR CYST EXCISION: SHX22

## 2019-11-01 SURGERY — EXCISION, BRANCHIAL CLEFT CYST
Anesthesia: General | Laterality: Left

## 2019-11-01 MED ORDER — MIDAZOLAM HCL 2 MG/2ML IJ SOLN
INTRAMUSCULAR | Status: AC
  Filled 2019-11-01: qty 2

## 2019-11-01 MED ORDER — PROPOFOL 10 MG/ML IV BOLUS
INTRAVENOUS | Status: DC | PRN
  Administered 2019-11-01: 200 mg via INTRAVENOUS

## 2019-11-01 MED ORDER — FENTANYL CITRATE (PF) 100 MCG/2ML IJ SOLN
INTRAMUSCULAR | Status: AC
  Filled 2019-11-01: qty 2

## 2019-11-01 MED ORDER — LACTATED RINGERS IV SOLN: INTRAVENOUS | Status: DC | PRN

## 2019-11-01 MED ORDER — SUGAMMADEX SODIUM 500 MG/5ML IV SOLN
INTRAVENOUS | Status: DC | PRN
  Administered 2019-11-01: 600 mg via INTRAVENOUS

## 2019-11-01 MED ORDER — OXYCODONE HCL 5 MG/5ML PO SOLN: 5.0000 mg | Freq: Once | ORAL | Status: AC | PRN

## 2019-11-01 MED ORDER — EPHEDRINE SULFATE 50 MG/ML IJ SOLN
INTRAMUSCULAR | Status: DC | PRN
  Administered 2019-11-01 (×2): 5 mg via INTRAVENOUS

## 2019-11-01 MED ORDER — DEXAMETHASONE SODIUM PHOSPHATE 10 MG/ML IJ SOLN
INTRAMUSCULAR | Status: AC
  Filled 2019-11-01: qty 1

## 2019-11-01 MED ORDER — LIDOCAINE HCL (PF) 2 % IJ SOLN
INTRAMUSCULAR | Status: AC
  Filled 2019-11-01: qty 10

## 2019-11-01 MED ORDER — ACETAMINOPHEN 10 MG/ML IV SOLN: 1000.0000 mg | Freq: Once | INTRAVENOUS | Status: DC | PRN

## 2019-11-01 MED ORDER — ONDANSETRON HCL 4 MG/2ML IJ SOLN: 4.0000 mg | Freq: Once | INTRAMUSCULAR | Status: DC | PRN

## 2019-11-01 MED ORDER — OXYCODONE HCL 5 MG PO TABS
ORAL_TABLET | ORAL | Status: AC
  Filled 2019-11-01: qty 1

## 2019-11-01 MED ORDER — DEXAMETHASONE SODIUM PHOSPHATE 10 MG/ML IJ SOLN
INTRAMUSCULAR | Status: DC | PRN
  Administered 2019-11-01: 10 mg via INTRAVENOUS

## 2019-11-01 MED ORDER — SUCCINYLCHOLINE CHLORIDE 200 MG/10ML IV SOSY
PREFILLED_SYRINGE | INTRAVENOUS | Status: AC
  Filled 2019-11-01: qty 10

## 2019-11-01 MED ORDER — EPHEDRINE 5 MG/ML INJ
INTRAVENOUS | Status: AC
  Filled 2019-11-01: qty 10

## 2019-11-01 MED ORDER — GLYCOPYRROLATE 0.2 MG/ML IJ SOLN
INTRAMUSCULAR | Status: DC | PRN
  Administered 2019-11-01: .2 mg via INTRAVENOUS

## 2019-11-01 MED ORDER — CEFAZOLIN SODIUM-DEXTROSE 2-4 GM/100ML-% IV SOLN: 2.0000 g | Freq: Once | INTRAVENOUS | Status: DC

## 2019-11-01 MED ORDER — VASOPRESSIN 20 UNIT/ML IV SOLN
INTRAVENOUS | Status: DC | PRN
  Administered 2019-11-01: 2 [IU] via INTRAVENOUS
  Administered 2019-11-01: 1 [IU] via INTRAVENOUS

## 2019-11-01 MED ORDER — ONDANSETRON HCL 4 MG/2ML IJ SOLN
INTRAMUSCULAR | Status: DC | PRN
  Administered 2019-11-01: 4 mg via INTRAVENOUS

## 2019-11-01 MED ORDER — REMIFENTANIL HCL 1 MG IV SOLR: INTRAVENOUS | Status: DC | PRN

## 2019-11-01 MED ORDER — REMIFENTANIL HCL 1 MG IV SOLR
INTRAVENOUS | Status: AC
  Filled 2019-11-01: qty 2000

## 2019-11-01 MED ORDER — VASOPRESSIN 20 UNIT/ML IV SOLN
INTRAVENOUS | Status: AC
  Filled 2019-11-01: qty 1

## 2019-11-01 MED ORDER — MIDAZOLAM HCL 2 MG/2ML IJ SOLN
INTRAMUSCULAR | Status: DC | PRN
  Administered 2019-11-01 (×2): 1 mg via INTRAVENOUS

## 2019-11-01 MED ORDER — FENTANYL CITRATE (PF) 100 MCG/2ML IJ SOLN
INTRAMUSCULAR | Status: DC | PRN
  Administered 2019-11-01: 50 ug via INTRAVENOUS

## 2019-11-01 MED ORDER — OXYCODONE HCL 5 MG PO TABS
5.0000 mg | ORAL_TABLET | Freq: Once | ORAL | Status: AC | PRN
  Administered 2019-11-01: 5 mg via ORAL

## 2019-11-01 MED ORDER — ACETAMINOPHEN 10 MG/ML IV SOLN
INTRAVENOUS | Status: DC | PRN
  Administered 2019-11-01: 1000 mg via INTRAVENOUS

## 2019-11-01 MED ORDER — ACETAMINOPHEN 10 MG/ML IV SOLN
INTRAVENOUS | Status: AC
  Filled 2019-11-01: qty 100

## 2019-11-01 MED ORDER — PHENYLEPHRINE HCL (PRESSORS) 10 MG/ML IV SOLN
INTRAVENOUS | Status: DC | PRN
  Administered 2019-11-01 (×2): 200 ug via INTRAVENOUS

## 2019-11-01 MED ORDER — LIDOCAINE-EPINEPHRINE (PF) 1 %-1:200000 IJ SOLN
INTRAMUSCULAR | Status: AC
  Filled 2019-11-01: qty 30

## 2019-11-01 MED ORDER — SUCCINYLCHOLINE CHLORIDE 20 MG/ML IJ SOLN
INTRAMUSCULAR | Status: DC | PRN
  Administered 2019-11-01: 200 mg via INTRAVENOUS

## 2019-11-01 MED ORDER — ROCURONIUM BROMIDE 100 MG/10ML IV SOLN
INTRAVENOUS | Status: DC | PRN
  Administered 2019-11-01: 50 mg via INTRAVENOUS

## 2019-11-01 MED ORDER — PHENYLEPHRINE HCL (PRESSORS) 10 MG/ML IV SOLN
INTRAVENOUS | Status: AC
  Filled 2019-11-01: qty 1

## 2019-11-01 MED ORDER — LIDOCAINE-EPINEPHRINE (PF) 1 %-1:200000 IJ SOLN
INTRAMUSCULAR | Status: DC | PRN
  Administered 2019-11-01: 5 mL

## 2019-11-01 MED ORDER — PROPOFOL 10 MG/ML IV BOLUS
INTRAVENOUS | Status: AC
  Filled 2019-11-01: qty 40

## 2019-11-01 MED ORDER — FAMOTIDINE 20 MG PO TABS: 20.0000 mg | ORAL_TABLET | Freq: Once | ORAL | Status: DC

## 2019-11-01 MED ORDER — GLYCOPYRROLATE 0.2 MG/ML IJ SOLN
INTRAMUSCULAR | Status: AC
  Filled 2019-11-01: qty 1

## 2019-11-01 MED ORDER — FENTANYL CITRATE (PF) 100 MCG/2ML IJ SOLN: 25.0000 ug | INTRAMUSCULAR | Status: DC | PRN

## 2019-11-01 MED ORDER — DEXTROSE 5 % IV SOLN
INTRAVENOUS | Status: DC | PRN
  Administered 2019-11-01: 08:00:00 3 g via INTRAVENOUS

## 2019-11-01 MED ORDER — SUGAMMADEX SODIUM 500 MG/5ML IV SOLN
INTRAVENOUS | Status: AC
  Filled 2019-11-01: qty 5

## 2019-11-01 MED ORDER — LACTATED RINGERS IV SOLN
INTRAVENOUS | Status: DC
  Administered 2019-11-01: 100 mL/h via INTRAVENOUS

## 2019-11-01 MED ORDER — LIDOCAINE HCL (CARDIAC) PF 100 MG/5ML IV SOSY
PREFILLED_SYRINGE | INTRAVENOUS | Status: DC | PRN
  Administered 2019-11-01: 100 mg via INTRAVENOUS

## 2019-11-01 MED ORDER — PHENYLEPHRINE HCL (PRESSORS) 10 MG/ML IV SOLN: INTRAVENOUS | Status: DC | PRN

## 2019-11-01 MED ORDER — REMIFENTANIL HCL 1 MG IV SOLR
INTRAVENOUS | Status: DC | PRN
  Administered 2019-11-01: .15 ug/kg/min via INTRAVENOUS

## 2019-11-01 MED ORDER — CEFAZOLIN SODIUM-DEXTROSE 2-4 GM/100ML-% IV SOLN
INTRAVENOUS | Status: AC
  Filled 2019-11-01: qty 100

## 2019-11-01 MED ORDER — BACITRACIN ZINC 500 UNIT/GM EX OINT
TOPICAL_OINTMENT | CUTANEOUS | Status: AC
  Filled 2019-11-01: qty 28.35

## 2019-11-01 MED ORDER — BACITRACIN 500 UNIT/GM EX OINT
TOPICAL_OINTMENT | CUTANEOUS | Status: DC | PRN
  Administered 2019-11-01: 1 via TOPICAL

## 2019-11-01 MED ORDER — SODIUM CHLORIDE 0.9 % IV SOLN
INTRAVENOUS | Status: DC | PRN
  Administered 2019-11-01: 50 ug/min via INTRAVENOUS

## 2019-11-01 MED ORDER — ONDANSETRON HCL 4 MG/2ML IJ SOLN
INTRAMUSCULAR | Status: AC
  Filled 2019-11-01: qty 4

## 2019-11-01 MED ORDER — FAMOTIDINE 20 MG PO TABS
ORAL_TABLET | ORAL | Status: AC
  Administered 2019-11-01: 20 mg
  Filled 2019-11-01: qty 1

## 2019-11-01 SURGICAL SUPPLY — 45 items
BLADE SURG 15 STRL LF DISP TIS (BLADE) ×1 IMPLANT
BLADE SURG 15 STRL SS (BLADE) ×1
CANISTER SUCT 1200ML W/VALVE (MISCELLANEOUS) ×2 IMPLANT
COVER WAND RF STERILE (DRAPES) ×1 IMPLANT
DRAIN TLS ROUND 10FR (DRAIN) ×2 IMPLANT
DRAPE MAG INST 16X20 L/F (DRAPES) ×2 IMPLANT
DRSG TEGADERM 2-3/8X2-3/4 SM (GAUZE/BANDAGES/DRESSINGS) ×1 IMPLANT
DRSG TEGADERM 4X4.75 (GAUZE/BANDAGES/DRESSINGS) ×1 IMPLANT
DRSG TELFA 4X3 1S NADH ST (GAUZE/BANDAGES/DRESSINGS) ×2 IMPLANT
ELECT CAUTERY BLADE TIP 2.5 (TIP) ×2
ELECT NEEDLE 20X.3 GREEN (MISCELLANEOUS)
ELECT REM PT RETURN 9FT ADLT (ELECTROSURGICAL) ×2
ELECTRODE CAUTERY BLDE TIP 2.5 (TIP) ×1 IMPLANT
ELECTRODE NDL 20X.3 GREEN (MISCELLANEOUS) ×1 IMPLANT
ELECTRODE NEEDLE 20X.3 GREEN (MISCELLANEOUS) IMPLANT
ELECTRODE REM PT RTRN 9FT ADLT (ELECTROSURGICAL) ×1 IMPLANT
GLOVE BIO SURGEON STRL SZ7.5 (GLOVE) ×3 IMPLANT
GLOVE BIOGEL PI IND STRL 7.0 (GLOVE) IMPLANT
GLOVE BIOGEL PI INDICATOR 7.0 (GLOVE) ×1
GLOVE PROTEXIS LATEX SZ 7.5 (GLOVE) ×2 IMPLANT
GLOVE SURG LATEX 7.5 PF (GLOVE) ×1 IMPLANT
GOWN STRL REUS W/ TWL LRG LVL3 (GOWN DISPOSABLE) ×3 IMPLANT
GOWN STRL REUS W/TWL LRG LVL3 (GOWN DISPOSABLE) ×3
HEMOSTAT SURGICEL 2X3 (HEMOSTASIS) ×2 IMPLANT
HOOK STAY BLUNT/RETRACTOR 5M (MISCELLANEOUS) ×2 IMPLANT
KIT TURNOVER KIT A (KITS) ×2 IMPLANT
LABEL OR SOLS (LABEL) ×2 IMPLANT
NS IRRIG 500ML POUR BTL (IV SOLUTION) ×2 IMPLANT
PACK HEAD/NECK (MISCELLANEOUS) ×2 IMPLANT
PROBE MONO 100X0.75 ELECT 1.9M (MISCELLANEOUS) ×1 IMPLANT
SHEARS HARMONIC 9CM CVD (BLADE) ×2 IMPLANT
SPONGE KITTNER 5P (MISCELLANEOUS) ×2 IMPLANT
SUT ETHILON 3-0 FS-10 30 BLK (SUTURE)
SUT ETHILON 5 0 P 3 18 (SUTURE)
SUT NYLON ETHILON 5-0 P-3 1X18 (SUTURE) ×1 IMPLANT
SUT PROLENE 5 0 PS 3 (SUTURE) ×1 IMPLANT
SUT SILK 2 0 (SUTURE) ×1
SUT SILK 2 0 SH (SUTURE) ×1 IMPLANT
SUT SILK 2-0 18XBRD TIE 12 (SUTURE) ×2 IMPLANT
SUT SILK 3 0 (SUTURE) ×1
SUT SILK 3-0 18XBRD TIE 12 (SUTURE) ×1 IMPLANT
SUT VIC AB 4-0 RB1 18 (SUTURE) ×2 IMPLANT
SUTURE EHLN 3-0 FS-10 30 BLK (SUTURE) ×1 IMPLANT
SYR 3ML 18GX1 1/2 (SYRINGE) ×1 IMPLANT
SYSTEM CHEST DRAIN TLS 7FR (DRAIN) ×2 IMPLANT

## 2019-11-01 NOTE — Transfer of Care (Signed)
Immediate Anesthesia Transfer of Care Note  Patient: Jimmy Flores  Procedure(s) Performed: BRACHIAL CLEFT CYST EXCISION (Left )  Patient Location: PACU  Anesthesia Type:General  Level of Consciousness: awake, alert , oriented and patient cooperative  Airway & Oxygen Therapy: Patient Spontanous Breathing and Patient connected to face mask oxygen  Post-op Assessment: Report given to RN and Post -op Vital signs reviewed and stable  Post vital signs: Reviewed and stable  Last Vitals:  Vitals Value Taken Time  BP    Temp    Pulse 100 11/01/19 0900  Resp 17 11/01/19 0900  SpO2 93 % 11/01/19 0900  Vitals shown include unvalidated device data.  Last Pain:  Vitals:   11/01/19 0624  TempSrc: Temporal  PainSc: 2       Patients Stated Pain Goal: 1 (11/01/19 3329)  Complications: No apparent anesthesia complications
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
## 2019-11-01 NOTE — Anesthesia Procedure Notes (Signed)
Procedure Name: Intubation Performed by: Mohammed Kindle, CRNA Pre-anesthesia Checklist: Patient identified, Emergency Drugs available, Suction available and Patient being monitored Patient Re-evaluated:Patient Re-evaluated prior to induction Oxygen Delivery Method: Circle system utilized Preoxygenation: Pre-oxygenation with 100% oxygen Induction Type: IV induction and Rapid sequence Laryngoscope Size: McGraph and 4 Grade View: Grade I Tube type: Oral Tube size: 7.5 mm Number of attempts: 1 Airway Equipment and Method: Stylet,  Patient positioned with wedge pillow and Video-laryngoscopy Placement Confirmation: ETT inserted through vocal cords under direct vision,  positive ETCO2,  CO2 detector and breath sounds checked- equal and bilateral Secured at: 21 cm Tube secured with: Tape Dental Injury: Teeth and Oropharynx as per pre-operative assessment

## 2019-11-01 NOTE — Op Note (Signed)
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  It was attached superiorly and deep with some blood vessels.  No neck nodes visible.   Procedure: The patient was brought to the operating room and placed in the supine position.  He was given general anesthesia by oral endotracheal intubation.  His head was turned to the right side and neck extended a little bit.  He has a very thick short neck and difficult to get to.  The mass was palpable deep in the neck.  A skin crease line was marked inferiorly on the left lateral neck and then 5 mL of 1% Xylocaine with epi 1: 100,000 was used for infiltration of the skin and subcu here.  He was then prepped and draped in a sterile fashion.  Incision was created through the skin and subcu.  There is a significant amount of fatty tissue in the subcu.  The platysma was fairly thick and this was cut across with the harmonic.  The anterior edge of the sternocleidomastoid muscle was found.  There is a lot of fatty tissue deep to the platysma muscle that was divided.  The superficial border of the cystic mass was then encountered.  There is little bit of scar tissue here from previous aspirations of the cyst.  This was freed from its overlying muscle and much of the cyst could be dissected with some blunt dissection.  Muscle bands over the top of it had to be cut with the harmonic.  As dissection was freed all the way around it was deep to the angle of the jaw.  There is still some deep attachments posteriorly and superiorly.  I was finally able to free up this enough to deliver the cyst from the wound to put a little traction on the  posterior attachments.  There were several small vessels that were attached here.  These were clamped to prevent bleeding.  There are couple small veins that were oozing at the depth of wound.  The area was flushed to be able to see this better and the small vessels were cauterized with the harmonic scalpel.  Wound was irrigated again and it appeared very dry.  There is no further bleeding that was encountered.  There were no lymph nodes visible in the wound.  A piece of Surgicel was placed down the depth of the wound and then a 10 French TLS drain was placed through separate stab incision and left down in the wound for removing any oozing.  The wound was then closed using 4-0 Vicryls interrupted on the platysma layer.  Once this was closed 4-0 Vicryls were used for closing the subcu in an interrupted fashion.  5-0 Prolene was then used in a running locking suture to hold the skin edges in apposition.  Wound was covered with bacitracin, Telfa, and then a Tegaderm dressing.  The drain was placed to low continuous Vacutainer suction and taped to the skin.  Patient was awakened taken to the recovery room in satisfactory condition.  There were no operative complications.  Dispo:   To PACU to be discharged home  Plan: We will follow-up in the office tomorrow morning and we will change the dressing and remove the drain.  He will have Vacutainer's to change this out periodically this evening to make sure that suction remains on the drain.  He already has antibiotics and pain medication at home that he can use.  Beverly Sessions Tyheem Boughner  11/01/2019 8:49 AM

## 2019-11-01 NOTE — H&P (Signed)
H&P has been reviewed and patient reevaluated, no changes necessary. To be downloaded later.  

## 2019-11-01 NOTE — Discharge Instructions (Signed)

## 2019-11-01 NOTE — Anesthesia Postprocedure Evaluation (Signed)
Anesthesia Post Note  Patient: Jimmy Flores  Procedure(s) Performed: BRACHIAL CLEFT CYST EXCISION (Left )  Patient location during evaluation: PACU Anesthesia Type: General Level of consciousness: awake and alert Pain management: pain level controlled Vital Signs Assessment: post-procedure vital signs reviewed and stable Respiratory status: spontaneous breathing, nonlabored ventilation, respiratory function stable and patient connected to nasal cannula oxygen Cardiovascular status: blood pressure returned to baseline and stable Postop Assessment: no apparent nausea or vomiting Anesthetic complications: no     Last Vitals:  Vitals:   11/01/19 0928 11/01/19 0937  BP: 123/72 125/60  Pulse: 87 89  Resp: 13 16  Temp: (!) 36.1 C (!) 36.1 C  SpO2: 95% 98%    Last Pain:  Vitals:   11/01/19 0937  TempSrc: Temporal  PainSc: 4                  Corinda Gubler

## 2019-11-03 LAB — SURGICAL PATHOLOGY

## 2019-11-15 ENCOUNTER — Other Ambulatory Visit: Payer: Self-pay | Admitting: Family Medicine

## 2019-11-15 NOTE — Telephone Encounter (Signed)
Routing to privider

## 2019-11-15 NOTE — Telephone Encounter (Signed)
Called patient to schedule a follow up. LVM for patient to return call to the office 

## 2019-11-17 NOTE — Telephone Encounter (Signed)
Patient scheduled an appointment on 11/29/19

## 2019-11-29 ENCOUNTER — Ambulatory Visit
Admission: RE | Admit: 2019-11-29 | Discharge: 2019-11-29 | Disposition: A | Discharge status: Home / Self Care | Payer: BC Managed Care – PPO | Source: Ambulatory Visit | Attending: Family Medicine | Admitting: Family Medicine

## 2019-11-29 ENCOUNTER — Other Ambulatory Visit: Payer: Self-pay

## 2019-11-29 ENCOUNTER — Encounter: Payer: Self-pay | Admitting: Family Medicine

## 2019-11-29 ENCOUNTER — Ambulatory Visit (INDEPENDENT_AMBULATORY_CARE_PROVIDER_SITE_OTHER): Payer: BC Managed Care – PPO | Admitting: Family Medicine

## 2019-11-29 VITALS — BP 165/77 | HR 76 | Temp 98.3°F | Ht 72.64 in | Wt 382.2 lb

## 2019-11-29 DIAGNOSIS — M545 Low back pain, unspecified: Secondary | ICD-10-CM

## 2019-11-29 IMAGING — DX DG LUMBAR SPINE COMPLETE 4+V
6 series · 6 of 6 positions shown · non-contrast
Comparison: None.

CLINICAL DATA: Low right-sided back pain for 3 years, worsening.

EXAM:
LUMBAR SPINE - COMPLETE 4+ VIEW

[l-spine ap]
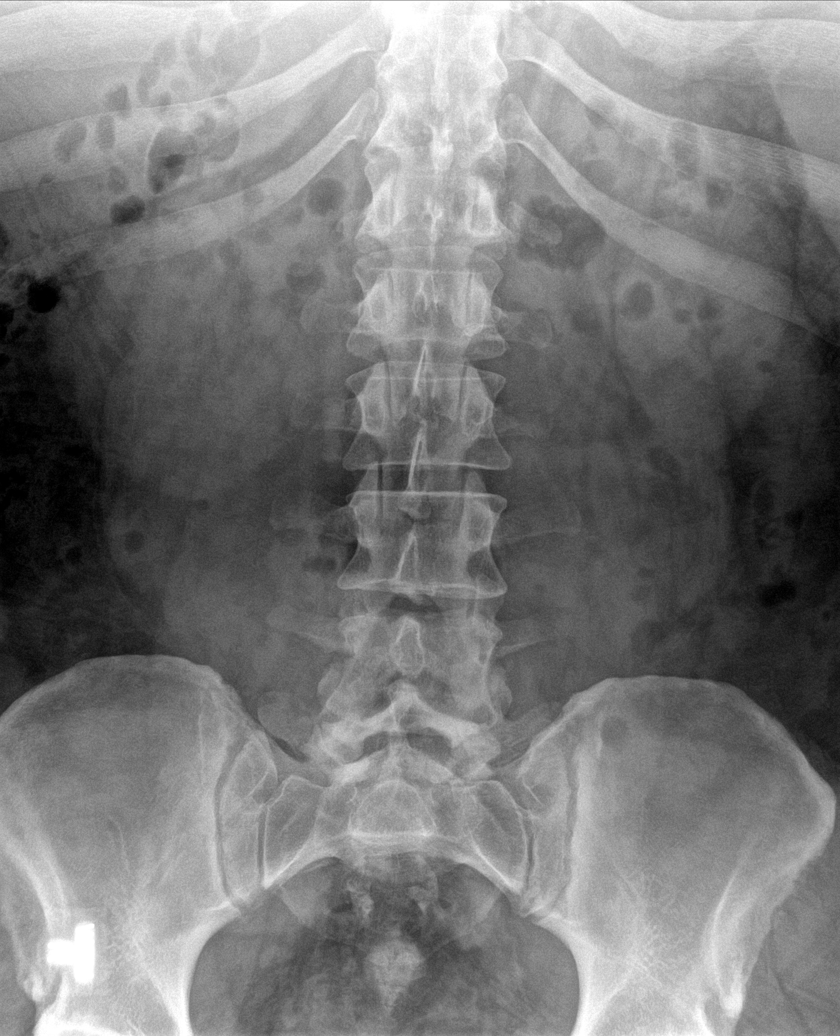

[l-spine obl (1 of 3)]
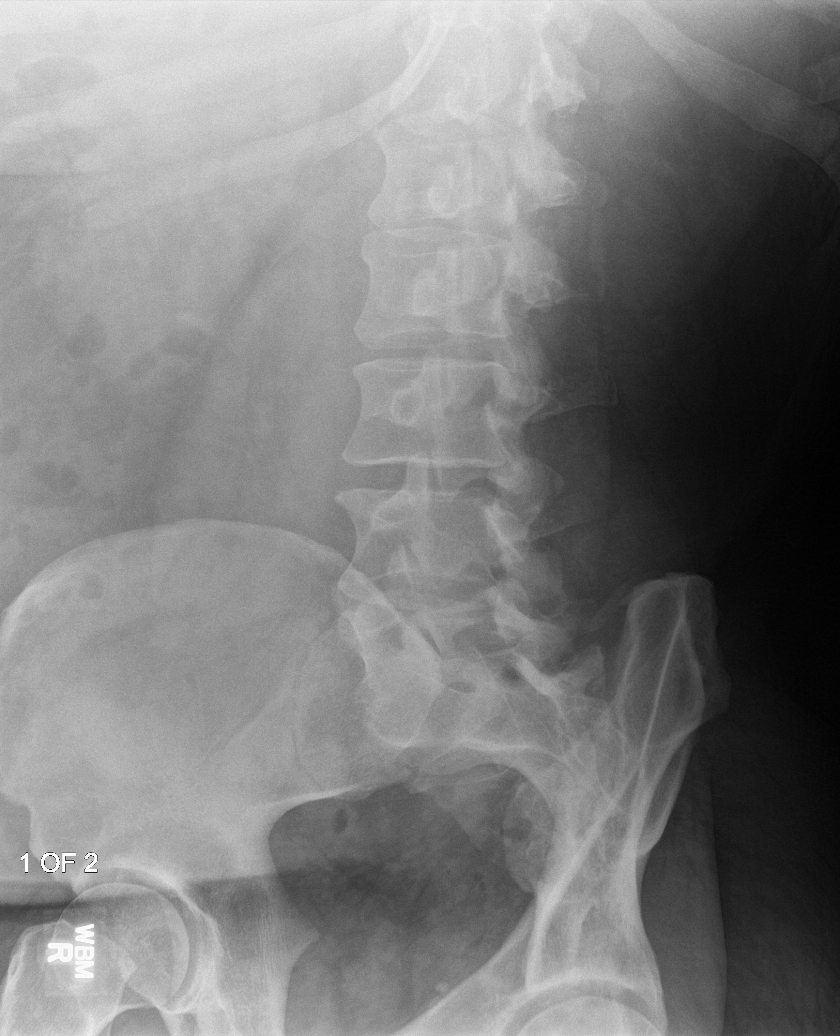

[l-spine obl (2 of 3)]
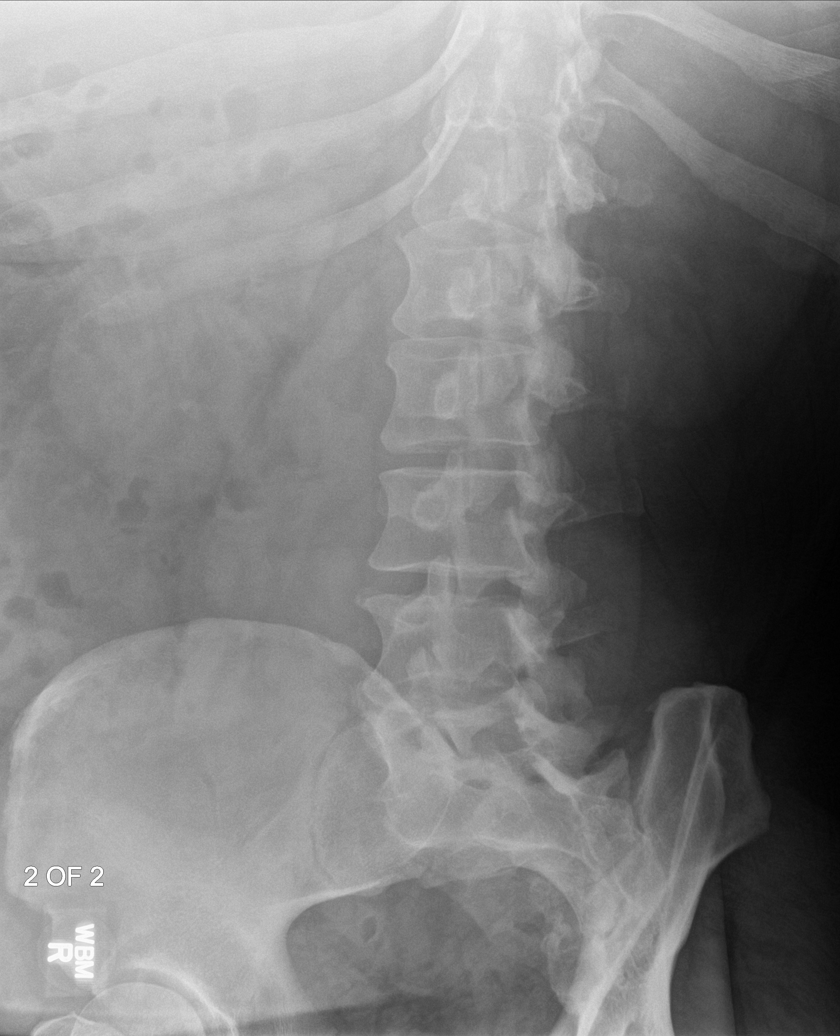

[l-spine lat]
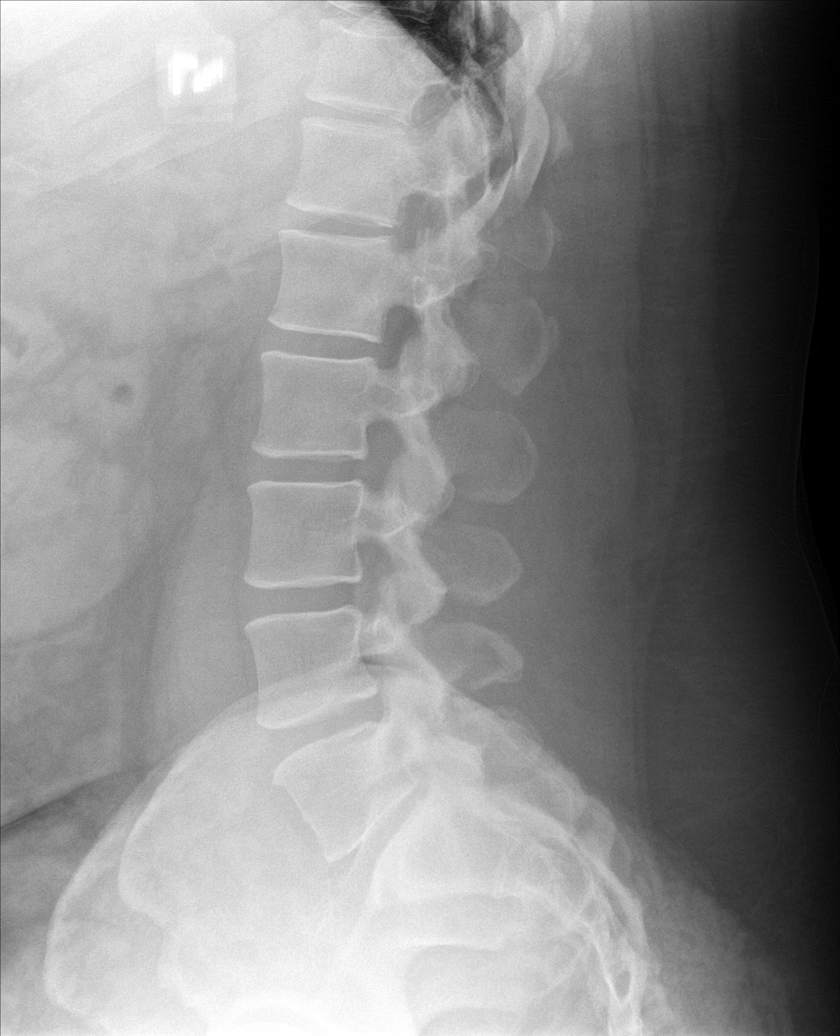

[l-spine spot]
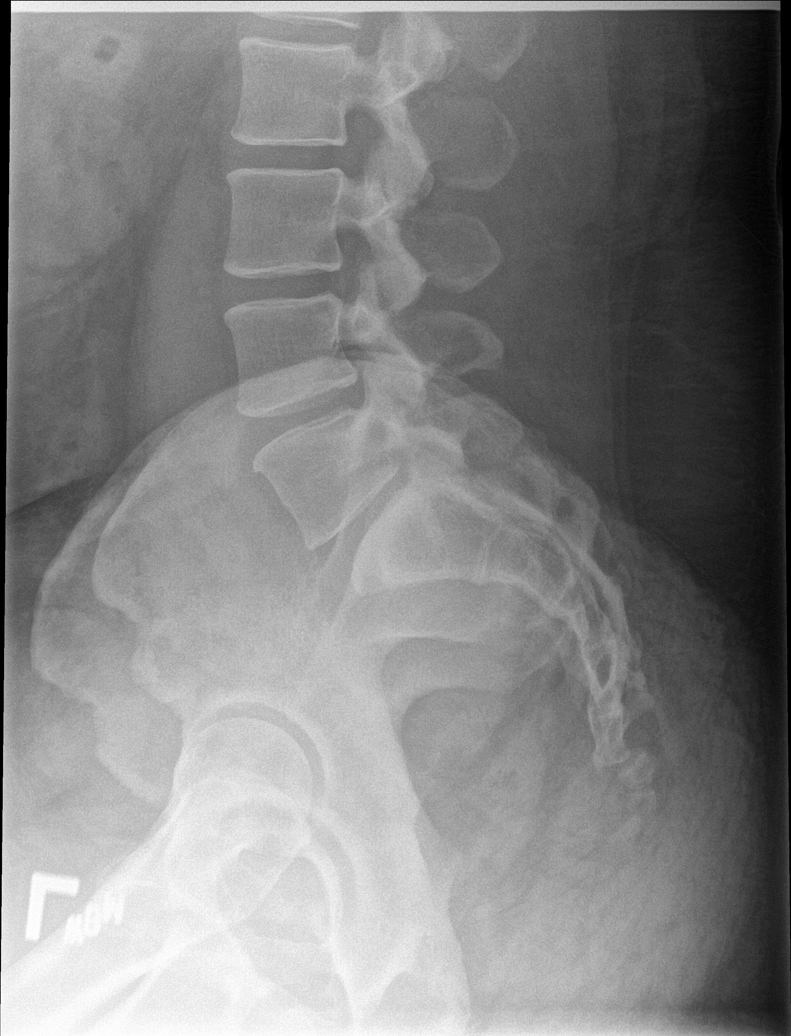

[l-spine obl (3 of 3)]
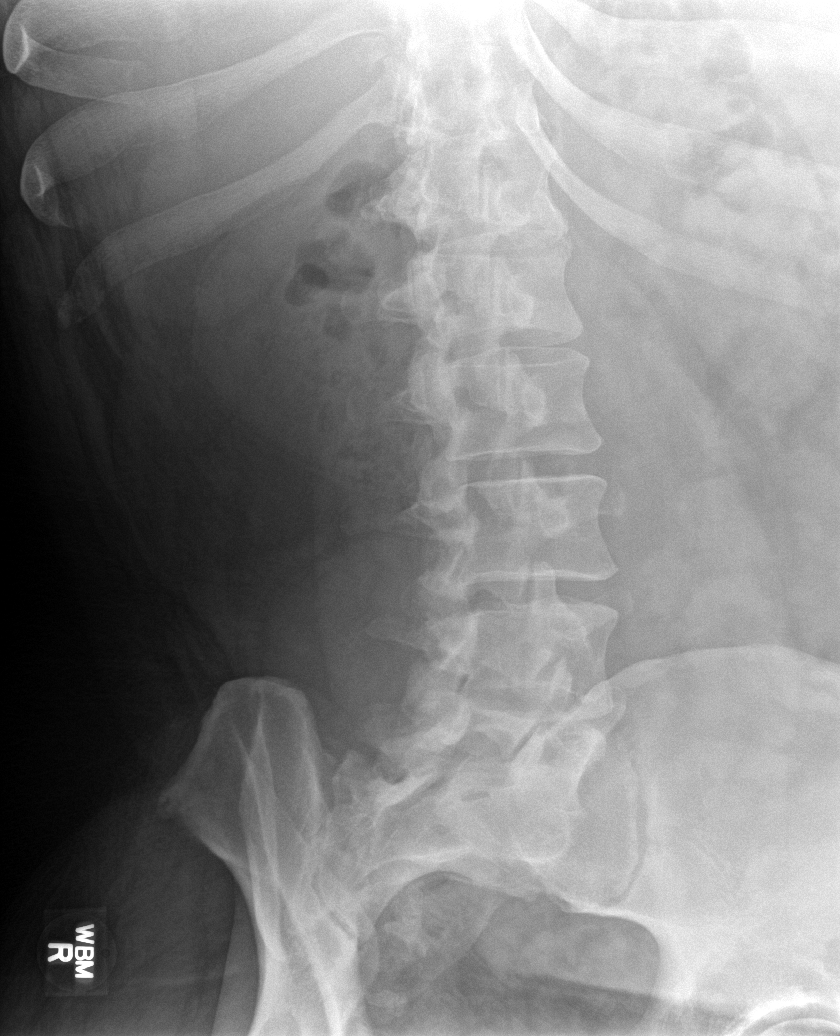

[6 of 6 positions shown; findings below may reference images not displayed]

FINDINGS: Transitional lumbosacral anatomy. Transitional segment is assigned
L5 with presumed vestigial ribs at T12. The alignment is normal. The
disc spaces are preserved. No evidence of acute fracture or pars
defect. Mild facet degenerative changes inferiorly. Posterior
subluxation of the coccyx on the lateral view may be developmental
or related to remote injury.
IMPRESSION: Transitional lumbosacral anatomy with mild facet degenerative
changes inferiorly. No acute osseous findings or malalignment in the
lumbar spine.

Posterior subluxation of the coccyx may be developmental or
secondary to remote injury.

## 2019-11-29 MED ORDER — NAPROXEN 500 MG PO TABS: 500.0000 mg | ORAL_TABLET | Freq: Two times a day (BID) | ORAL | 3 refills | Status: DC

## 2019-11-29 MED ORDER — CYCLOBENZAPRINE HCL 10 MG PO TABS: 5.0000 mg | ORAL_TABLET | Freq: Every day | ORAL | 1 refills | Status: DC

## 2019-11-29 NOTE — Assessment & Plan Note (Signed)
Chronic off and on. Has never had imaging. Will arrange for x-ray. Will start exercises and flexeril and naproxen. Recheck 1 month. Call with any concerns.

## 2019-11-29 NOTE — Progress Notes (Signed)
BP (!) 165/77 (BP Location: Left Arm, Patient Position: Sitting, Cuff Size: Normal)   Pulse 76   Temp 98.3 F (36.8 C) (Oral)   Ht 6' 0.64" (1.845 m)   Wt (!) 382 lb 3.2 oz (173.4 kg)   SpO2 95%   BMI 50.93 kg/m    Subjective:    Patient ID: Jimmy Flores, male    DOB: 1986-01-23, 34 y.o.   MRN: [id number]  HPI: Josehua Hammar is a 34 y.o. male  Chief Complaint  Patient presents with  . Back Pain  . Spasms   BACK PAIN Duration: 2-3 years off and on, getting worse with exercise and certain movements Mechanism of injury: no trauma Location: Right and low back Onset: gradual Severity: mild to moderate Quality: tight Frequency: intermittent Radiation: buttocks and R leg above the knee Aggravating factors: lifting, movement, walking, laying, bending and prolonged sitting Alleviating factors: rest, ice, NSAIDs and muscle relaxer Status: worse Treatments attempted: rest, ice, heat, APAP, aleve and HEP  Relief with NSAIDs?: No NSAIDs Taken Nighttime pain:  yes Paresthesias / decreased sensation:  yes Bowel / bladder incontinence:  no Fevers:  no Dysuria / urinary frequency:  no  Relevant past medical, surgical, family and social history reviewed and updated as indicated. Interim medical history since our last visit reviewed. Allergies and medications reviewed and updated.  Review of Systems  Constitutional: Negative.   Respiratory: Negative.   Cardiovascular: Negative.   Gastrointestinal: Negative.   Musculoskeletal: Positive for back pain and myalgias. Negative for arthralgias, gait problem, joint swelling, neck pain and neck stiffness.  Skin: Negative.   Neurological: Negative.  Negative for dizziness, tremors, seizures, syncope, facial asymmetry, speech difficulty, weakness, light-headedness, numbness and headaches.  Psychiatric/Behavioral: Negative.     Per HPI unless specifically indicated above     Objective:    BP (!) 165/77 (BP Location: Left Arm,  Patient Position: Sitting, Cuff Size: Normal)   Pulse 76   Temp 98.3 F (36.8 C) (Oral)   Ht 6' 0.64" (1.845 m)   Wt (!) 382 lb 3.2 oz (173.4 kg)   SpO2 95%   BMI 50.93 kg/m   Wt Readings from Last 3 Encounters:  11/29/19 (!) 382 lb 3.2 oz (173.4 kg)  11/01/19 (!) 383 lb 1.9 oz (173.8 kg)  10/11/19 (!) 383 lb (173.7 kg)    Physical Exam Vitals and nursing note reviewed.  Constitutional:      General: He is not in acute distress.    Appearance: Normal appearance. He is not ill-appearing, toxic-appearing or diaphoretic.  HENT:     Head: Normocephalic and atraumatic.     Right Ear: External ear normal.     Left Ear: External ear normal.     Nose: Nose normal.     Mouth/Throat:     Mouth: Mucous membranes are moist.     Pharynx: Oropharynx is clear.  Eyes:     General: No scleral icterus.       Right eye: No discharge.        Left eye: No discharge.     Extraocular Movements: Extraocular movements intact.     Conjunctiva/sclera: Conjunctivae normal.     Pupils: Pupils are equal, round, and reactive to light.  Cardiovascular:     Rate and Rhythm: Normal rate and regular rhythm.     Pulses: Normal pulses.     Heart sounds: Normal heart sounds. No murmur. No friction rub. No gallop.   Pulmonary:     Effort:  Pulmonary effort is normal. No respiratory distress.     Breath sounds: Normal breath sounds. No stridor. No wheezing, rhonchi or rales.  Chest:     Chest wall: No tenderness.  Musculoskeletal:        General: Normal range of motion.     Cervical back: Normal range of motion and neck supple.  Skin:    General: Skin is warm and dry.     Capillary Refill: Capillary refill takes less than 2 seconds.     Coloration: Skin is not jaundiced or pale.     Findings: No bruising, erythema, lesion or rash.  Neurological:     General: No focal deficit present.     Mental Status: He is alert and oriented to person, place, and time. Mental status is at baseline.  Psychiatric:         Mood and Affect: Mood normal.        Behavior: Behavior normal.        Thought Content: Thought content normal.        Judgment: Judgment normal.        Assessment & Plan:   Problem List Items Addressed This Visit      Other   Acute right-sided low back pain without sciatica - Primary    Chronic off and on. Has never had imaging. Will arrange for x-ray. Will start exercises and flexeril and naproxen. Recheck 1 month. Call with any concerns.       Relevant Medications   cyclobenzaprine (FLEXERIL) 10 MG tablet   naproxen (NAPROSYN) 500 MG tablet   Other Relevant Orders   DG Lumbar Spine Complete       Follow up plan: Return Physical in June.

## 2019-11-29 NOTE — Patient Instructions (Signed)

## 2020-01-18 ENCOUNTER — Encounter: Payer: BC Managed Care – PPO | Admitting: Family Medicine

## 2020-01-26 ENCOUNTER — Encounter: Payer: BC Managed Care – PPO | Admitting: Family Medicine

## 2020-02-09 ENCOUNTER — Other Ambulatory Visit: Payer: Self-pay

## 2020-02-09 ENCOUNTER — Ambulatory Visit (INDEPENDENT_AMBULATORY_CARE_PROVIDER_SITE_OTHER): Payer: BC Managed Care – PPO | Admitting: Family Medicine

## 2020-02-09 ENCOUNTER — Encounter: Payer: Self-pay | Admitting: Family Medicine

## 2020-02-09 VITALS — BP 145/88 | HR 73 | Temp 98.4°F | Ht 72.05 in | Wt 378.0 lb

## 2020-02-09 DIAGNOSIS — Z0000 Encounter for general adult medical examination without abnormal findings: Secondary | ICD-10-CM | POA: Diagnosis not present

## 2020-02-09 LAB — URINALYSIS, ROUTINE W REFLEX MICROSCOPIC
Bilirubin, UA: NEGATIVE
Glucose, UA: NEGATIVE
Ketones, UA: NEGATIVE
Leukocytes,UA: NEGATIVE
Nitrite, UA: NEGATIVE
Protein,UA: NEGATIVE
RBC, UA: NEGATIVE
Specific Gravity, UA: 1.02 (ref 1.005–1.030)
Urobilinogen, Ur: 0.2 mg/dL (ref 0.2–1.0)
pH, UA: 6 (ref 5.0–7.5)

## 2020-02-09 NOTE — Patient Instructions (Signed)
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
## 2020-02-10 LAB — COMPREHENSIVE METABOLIC PANEL
ALT: 40 IU/L (ref 0–44)
AST: 23 IU/L (ref 0–40)
Albumin/Globulin Ratio: 1.4 (ref 1.2–2.2)
Albumin: 4.6 g/dL (ref 4.0–5.0)
Alkaline Phosphatase: 108 IU/L (ref 48–121)
BUN/Creatinine Ratio: 12 (ref 9–20)
BUN: 11 mg/dL (ref 6–20)
Bilirubin Total: 0.4 mg/dL (ref 0.0–1.2)
CO2: 24 mmol/L (ref 20–29)
Calcium: 9.6 mg/dL (ref 8.7–10.2)
Chloride: 100 mmol/L (ref 96–106)
Creatinine, Ser: 0.92 mg/dL (ref 0.76–1.27)
GFR calc Af Amer: 123 mL/min/{1.73_m2} (ref >59)
GFR calc non Af Amer: 107 mL/min/{1.73_m2} (ref >59)
Globulin, Total: 3.2 g/dL (ref 1.5–4.5)
Glucose: 93 mg/dL (ref 65–99)
Potassium: 4.9 mmol/L (ref 3.5–5.2)
Sodium: 140 mmol/L (ref 134–144)
Total Protein: 7.8 g/dL (ref 6.0–8.5)

## 2020-02-10 LAB — CBC WITH DIFFERENTIAL/PLATELET
Basophils Absolute: 0 10*3/uL (ref 0.0–0.2)
Basos: 1 %
EOS (ABSOLUTE): 0.2 10*3/uL (ref 0.0–0.4)
Eos: 3 %
Hematocrit: 42.6 % (ref 37.5–51.0)
Hemoglobin: 14.8 g/dL (ref 13.0–17.7)
Immature Grans (Abs): 0 10*3/uL (ref 0.0–0.1)
Immature Granulocytes: 0 %
Lymphocytes Absolute: 1.9 10*3/uL (ref 0.7–3.1)
Lymphs: 26 %
MCH: 28.7 pg (ref 26.6–33.0)
MCHC: 34.7 g/dL (ref 31.5–35.7)
MCV: 83 fL (ref 79–97)
Monocytes Absolute: 0.4 10*3/uL (ref 0.1–0.9)
Monocytes: 6 %
Neutrophils Absolute: 4.6 10*3/uL (ref 1.4–7.0)
Neutrophils: 64 %
Platelets: 266 10*3/uL (ref 150–450)
RBC: 5.15 x10E6/uL (ref 4.14–5.80)
RDW: 13.4 % (ref 11.6–15.4)
WBC: 7.1 10*3/uL (ref 3.4–10.8)

## 2020-02-10 LAB — HEPATITIS C ANTIBODY: Hep C Virus Ab: <0.1 s/co ratio (ref 0.0–0.9)

## 2020-02-10 LAB — LIPID PANEL W/O CHOL/HDL RATIO
Cholesterol, Total: 231 mg/dL — ABNORMAL HIGH (ref 100–199)
HDL: 47 mg/dL (ref >39)
LDL Chol Calc (NIH): 162 mg/dL — ABNORMAL HIGH (ref 0–99)
Triglycerides: 124 mg/dL (ref 0–149)
VLDL Cholesterol Cal: 22 mg/dL (ref 5–40)

## 2020-02-10 LAB — TSH: TSH: 1.85 u[IU]/mL (ref 0.450–4.500)

## 2020-03-21 DIAGNOSIS — Z3009 Encounter for other general counseling and advice on contraception: Secondary | ICD-10-CM | POA: Diagnosis not present

## 2020-04-04 DIAGNOSIS — Z302 Encounter for sterilization: Secondary | ICD-10-CM | POA: Diagnosis not present

## 2020-07-04 ENCOUNTER — Emergency Department: Payer: Managed Care, Other (non HMO)

## 2020-07-04 ENCOUNTER — Emergency Department
Admission: EM | Admit: 2020-07-04 | Discharge: 2020-07-04 | Disposition: A | Discharge status: Home / Self Care | Payer: Managed Care, Other (non HMO) | Attending: Emergency Medicine | Admitting: Emergency Medicine

## 2020-07-04 ENCOUNTER — Other Ambulatory Visit: Payer: Self-pay

## 2020-07-04 DIAGNOSIS — M5459 Other low back pain: Secondary | ICD-10-CM | POA: Diagnosis not present

## 2020-07-04 DIAGNOSIS — Z87891 Personal history of nicotine dependence: Secondary | ICD-10-CM | POA: Insufficient documentation
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
## 2020-07-04 MED ORDER — METHOCARBAMOL 500 MG PO TABS: 500.0000 mg | ORAL_TABLET | Freq: Four times a day (QID) | ORAL | 0 refills | Status: DC

## 2020-07-04 MED ORDER — MELOXICAM 7.5 MG PO TABS
15.0000 mg | ORAL_TABLET | Freq: Once | ORAL | Status: AC
  Administered 2020-07-04: 15 mg via ORAL
  Filled 2020-07-04: qty 2

## 2020-07-04 MED ORDER — MELOXICAM 15 MG PO TABS: 15.0000 mg | ORAL_TABLET | Freq: Every day | ORAL | 0 refills | Status: DC
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
## 2020-07-07 ENCOUNTER — Other Ambulatory Visit: Payer: Self-pay | Admitting: Nurse Practitioner

## 2020-07-07 ENCOUNTER — Ambulatory Visit: Payer: Self-pay

## 2020-07-07 MED ORDER — METHOCARBAMOL 500 MG PO TABS: 500.0000 mg | ORAL_TABLET | Freq: Four times a day (QID) | ORAL | 0 refills | Status: DC
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
## 2020-07-07 NOTE — Telephone Encounter (Signed)
Refill sent due to travel, but if ongoing pain will need to see provider.

## 2020-07-07 NOTE — Telephone Encounter (Signed)
Lvm to with message below

## 2020-07-25 ENCOUNTER — Ambulatory Visit: Payer: BC Managed Care – PPO | Admitting: Family Medicine

## 2021-02-13 ENCOUNTER — Ambulatory Visit (INDEPENDENT_AMBULATORY_CARE_PROVIDER_SITE_OTHER): Payer: Managed Care, Other (non HMO) | Admitting: Family Medicine

## 2021-02-13 ENCOUNTER — Other Ambulatory Visit: Payer: Self-pay

## 2021-02-13 ENCOUNTER — Encounter: Payer: Self-pay | Admitting: Family Medicine

## 2021-02-13 VITALS — BP 129/80 | HR 80 | Temp 98.1°F | Ht 72.8 in | Wt 386.2 lb

## 2021-02-13 DIAGNOSIS — Z0000 Encounter for general adult medical examination without abnormal findings: Secondary | ICD-10-CM

## 2021-02-13 LAB — URINALYSIS, ROUTINE W REFLEX MICROSCOPIC
Bilirubin, UA: NEGATIVE
Glucose, UA: NEGATIVE
Ketones, UA: NEGATIVE
Leukocytes,UA: NEGATIVE
Nitrite, UA: NEGATIVE
Protein,UA: NEGATIVE
Specific Gravity, UA: >1.03 — ABNORMAL HIGH (ref 1.005–1.030)
Urobilinogen, Ur: 0.2 mg/dL (ref 0.2–1.0)
pH, UA: 6 (ref 5.0–7.5)

## 2021-02-13 LAB — MICROSCOPIC EXAMINATION
Bacteria, UA: NONE SEEN
Epithelial Cells (non renal): NONE SEEN /hpf (ref 0–10)
WBC, UA: NONE SEEN /hpf (ref 0–5)
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
## 2021-02-14 LAB — COMPREHENSIVE METABOLIC PANEL
ALT: 25 IU/L (ref 0–44)
AST: 18 IU/L (ref 0–40)
Albumin/Globulin Ratio: 1.7 (ref 1.2–2.2)
Albumin: 4.5 g/dL (ref 4.0–5.0)
Alkaline Phosphatase: 108 IU/L (ref 44–121)
BUN/Creatinine Ratio: 13 (ref 9–20)
BUN: 12 mg/dL (ref 6–20)
Bilirubin Total: 0.4 mg/dL (ref 0.0–1.2)
CO2: 23 mmol/L (ref 20–29)
Calcium: 9.1 mg/dL (ref 8.7–10.2)
Chloride: 101 mmol/L (ref 96–106)
Creatinine, Ser: 0.89 mg/dL (ref 0.76–1.27)
Globulin, Total: 2.6 g/dL (ref 1.5–4.5)
Glucose: 114 mg/dL — ABNORMAL HIGH (ref 65–99)
Potassium: 4.7 mmol/L (ref 3.5–5.2)
Sodium: 138 mmol/L (ref 134–144)
Total Protein: 7.1 g/dL (ref 6.0–8.5)
eGFR: 113 mL/min/{1.73_m2} (ref >59)

## 2021-02-14 LAB — CBC WITH DIFFERENTIAL/PLATELET
Basophils Absolute: 0 10*3/uL (ref 0.0–0.2)
Basos: 1 %
EOS (ABSOLUTE): 0.2 10*3/uL (ref 0.0–0.4)
Eos: 4 %
Hematocrit: 39.2 % (ref 37.5–51.0)
Hemoglobin: 13.2 g/dL (ref 13.0–17.7)
Immature Grans (Abs): 0 10*3/uL (ref 0.0–0.1)
Immature Granulocytes: 0 %
Lymphocytes Absolute: 1.7 10*3/uL (ref 0.7–3.1)
Lymphs: 29 %
MCH: 27.1 pg (ref 26.6–33.0)
MCHC: 33.7 g/dL (ref 31.5–35.7)
MCV: 81 fL (ref 79–97)
Monocytes Absolute: 0.3 10*3/uL (ref 0.1–0.9)
Monocytes: 5 %
Neutrophils Absolute: 3.6 10*3/uL (ref 1.4–7.0)
Neutrophils: 61 %
Platelets: 228 10*3/uL (ref 150–450)
RBC: 4.87 x10E6/uL (ref 4.14–5.80)
RDW: 14.3 % (ref 11.6–15.4)
WBC: 5.8 10*3/uL (ref 3.4–10.8)

## 2021-02-14 LAB — LIPID PANEL W/O CHOL/HDL RATIO
Cholesterol, Total: 194 mg/dL (ref 100–199)
HDL: 38 mg/dL — ABNORMAL LOW (ref >39)
LDL Chol Calc (NIH): 138 mg/dL — ABNORMAL HIGH (ref 0–99)
Triglycerides: 101 mg/dL (ref 0–149)
VLDL Cholesterol Cal: 18 mg/dL (ref 5–40)

## 2021-02-14 LAB — TSH: TSH: 2.05 u[IU]/mL (ref 0.450–4.500)

## 2021-06-04 ENCOUNTER — Ambulatory Visit: Payer: Self-pay

## 2021-06-04 NOTE — Telephone Encounter (Signed)
Pt called in stating that his tonsils are swollen and has sore throat. He says that sore throat has been going on since 05/25/21 and got worse. He says that his uvula is swollen and its hard to tell where his tonsils and that are located now. He denies having any fever. His voice is hoarse and he is coughing up light brown mucus. Unable to schedule pt appt in timely manner so called and spoke with Iris, FC who gave appt on 06/05/21 virtual with Dr. Laural Benes. Pt verified appt ok, Care advice given and pt verbalized understanding. Advised pt to seek ED if throat closes and unable to breathe or swallow. No other questions/concerns noted.    Reason for Disposition  [1] Pus on tonsils (back of throat) AND [2]  fever AND [3] swollen neck lymph nodes ("glands")  Answer Assessment - Initial Assessment Questions 1. ONSET: "When did the throat start hurting?" (Hours or days ago)      05/25/21 2. SEVERITY: "How bad is the sore throat?" (Scale 1-10; mild, moderate or severe)   - MILD (1-3):  doesn't interfere with eating or normal activities   - MODERATE (4-7): interferes with eating some solids and normal activities   - SEVERE (8-10):  excruciating pain, interferes with most normal activities   - SEVERE DYSPHAGIA: can't swallow liquids, drooling     Mod-severe 3. STREP EXPOSURE: "Has there been any exposure to strep within the past week?" If Yes, ask: "What type of contact occurred?"      no 4.  VIRAL SYMPTOMS: "Are there any symptoms of a cold, such as a runny nose, cough, hoarse voice or red eyes?"      Hoarse, cough productive light brown  5. FEVER: "Do you have a fever?" If Yes, ask: "What is your temperature, how was it measured, and when did it start?"     No 6. PUS ON THE TONSILS: "Is there pus on the tonsils in the back of your throat?"     Hard to tell, really swollen 7. OTHER SYMPTOMS: "Do you have any other symptoms?" (e.g., difficulty breathing, headache, rash)     Hard time sleeping 8.  PREGNANCY: "Is there any chance you are pregnant?" "When was your last menstrual period?"     NA  Protocols used: Sore Throat-A-AH

## 2021-06-05 ENCOUNTER — Ambulatory Visit: Payer: Managed Care, Other (non HMO) | Admitting: Family Medicine

## 2021-06-05 ENCOUNTER — Other Ambulatory Visit: Payer: Self-pay

## 2021-06-05 ENCOUNTER — Encounter: Payer: Self-pay | Admitting: Family Medicine

## 2021-06-05 VITALS — BP 127/84 | HR 103 | Temp 98.2°F

## 2021-06-05 DIAGNOSIS — J020 Streptococcal pharyngitis: Secondary | ICD-10-CM

## 2021-06-05 DIAGNOSIS — J029 Acute pharyngitis, unspecified: Secondary | ICD-10-CM

## 2021-06-05 DIAGNOSIS — R0683 Snoring: Secondary | ICD-10-CM

## 2021-06-05 LAB — VERITOR FLU A/B WAIVED
Influenza A: NEGATIVE
Influenza B: NEGATIVE

## 2021-06-05 LAB — RAPID STREP SCREEN (MED CTR MEBANE ONLY): Strep Gp A Ag, IA W/Reflex: POSITIVE — AB

## 2021-06-05 MED ORDER — AMOXICILLIN-POT CLAVULANATE 875-125 MG PO TABS: 1.0000 | ORAL_TABLET | Freq: Two times a day (BID) | ORAL | 0 refills | Status: DC

## 2021-06-05 NOTE — Progress Notes (Signed)
BP 127/84   Pulse (!) 103   Temp 98.2 F (36.8 C)   SpO2 96%    Subjective:    Patient ID: Jimmy Flores, male    DOB: Jan 10, 1986, 35 y.o.   MRN: [id number]  HPI: Jimmy Flores is a 35 y.o. male  Chief Complaint  Patient presents with   Sore Throat    Patient states he has been sick since November 5. Patient has congestion, ST, and states his tonsils are swollen   UPPER RESPIRATORY TRACT INFECTION Duration: about 10 days Worst symptom: sore throat Fever: no Cough: no Shortness of breath: no Wheezing: no Chest pain: no Chest tightness: no Chest congestion: no Nasal congestion: yes Runny nose: yes Post nasal drip: yes Sneezing: no Sore throat: yes Swollen glands: no Sinus pressure: yes Headache: no Face pain: no Toothache: no Ear pain: no  Ear pressure: no  Eyes red/itching:no Eye drainage/crusting: no  Vomiting: no Rash: no Fatigue: yes Sick contacts: no Strep contacts: no  Context: worse Recurrent sinusitis: no Relief with OTC cold/cough medications: no  Treatments attempted: none   ???SLEEP APNEA Sleep apnea status: unknown Duration: chronic Last sleep study: never Treatments attempted: none Wakes feeling refreshed:  no Daytime hypersomnolence:  yes Fatigue:  yes Insomnia:  no Good sleep hygiene:  yes Difficulty falling asleep:  no Difficulty staying asleep:  no Snoring bothers bed partner:  yes Observed apnea by bed partner: yes Obesity:  yes Hypertension: no    Relevant past medical, surgical, family and social history reviewed and updated as indicated. Interim medical history since our last visit reviewed. Allergies and medications reviewed and updated.  Review of Systems  Constitutional:  Positive for fatigue. Negative for activity change, appetite change, chills, diaphoresis, fever and unexpected weight change.  HENT:  Positive for congestion, postnasal drip, rhinorrhea and sore throat. Negative for dental problem, drooling, ear  discharge, ear pain, facial swelling, hearing loss, mouth sores, nosebleeds, sinus pressure, sinus pain, sneezing, tinnitus, trouble swallowing and voice change.   Eyes: Negative.   Respiratory: Negative.    Cardiovascular: Negative.   Gastrointestinal: Negative.   Psychiatric/Behavioral: Negative.     Per HPI unless specifically indicated above     Objective:    BP 127/84   Pulse (!) 103   Temp 98.2 F (36.8 C)   SpO2 96%   Wt Readings from Last 3 Encounters:  02/13/21 (!) 386 lb 3.2 oz (175.2 kg)  07/04/20 (!) 390 lb (176.9 kg)  02/09/20 (!) 378 lb (171.5 kg)    Physical Exam Vitals and nursing note reviewed.  Constitutional:      General: He is not in acute distress.    Appearance: Normal appearance. He is well-developed. He is obese. He is not ill-appearing, toxic-appearing or diaphoretic.  HENT:     Head: Normocephalic and atraumatic.     Right Ear: Tympanic membrane, ear canal and external ear normal.     Left Ear: Tympanic membrane, ear canal and external ear normal.     Nose: Nose normal. No congestion or rhinorrhea.     Mouth/Throat:     Mouth: Mucous membranes are moist.     Pharynx: Pharyngeal swelling and posterior oropharyngeal erythema present.     Tonsils: Tonsillar exudate present. 2+ on the right. 2+ on the left.  Eyes:     General: No scleral icterus.       Right eye: No discharge.        Left eye: No discharge.  Extraocular Movements: Extraocular movements intact.     Conjunctiva/sclera: Conjunctivae normal.     Pupils: Pupils are equal, round, and reactive to light.  Cardiovascular:     Rate and Rhythm: Normal rate and regular rhythm.     Pulses: Normal pulses.     Heart sounds: Normal heart sounds. No murmur heard.   No friction rub. No gallop.  Pulmonary:     Effort: Pulmonary effort is normal. No respiratory distress.     Breath sounds: Normal breath sounds. No stridor. No wheezing, rhonchi or rales.  Chest:     Chest wall: No tenderness.   Musculoskeletal:        General: Normal range of motion.     Cervical back: Normal range of motion and neck supple.  Lymphadenopathy:     Cervical: Cervical adenopathy present.  Skin:    General: Skin is warm and dry.     Capillary Refill: Capillary refill takes less than 2 seconds.     Coloration: Skin is not jaundiced or pale.     Findings: No bruising, erythema, lesion or rash.  Neurological:     General: No focal deficit present.     Mental Status: He is alert and oriented to person, place, and time. Mental status is at baseline.  Psychiatric:        Mood and Affect: Mood normal.        Behavior: Behavior normal.        Thought Content: Thought content normal.        Judgment: Judgment normal.    Results for orders placed or performed in visit on 06/05/21  Rapid Strep Screen ([facility] ONLY)   Naso  Result Value Ref Range   Strep Gp A Ag, IA W/Reflex Positive (A) Negative  Veritor Flu A/B Waived  Result Value Ref Range   Influenza A Negative Negative   Influenza B Negative Negative      Assessment & Plan:   Problem List Items Addressed This Visit   None Visit Diagnoses     Strep pharyngitis    -  Primary   Will treat with augmentin. Call with any concerns or if not getting better.    Sore throat       + strep   Relevant Orders   Rapid Strep Screen ([facility] ONLY)   Veritor Flu A/B Waived (Completed)   Novel Coronavirus, NAA ([facility])   Snoring       Will get set up for home sleep study if possible when he finishes abx for strep   Relevant Orders   Ambulatory referral to Sleep Studies        Follow up plan: Return if symptoms worsen or fail to improve.

## 2021-06-06 LAB — SARS-COV-2, NAA 2 DAY TAT

## 2021-06-06 LAB — NOVEL CORONAVIRUS, NAA: SARS-CoV-2, NAA: NOT DETECTED

## 2021-07-03 ENCOUNTER — Ambulatory Visit: Payer: Managed Care, Other (non HMO) | Attending: Otolaryngology

## 2021-07-03 DIAGNOSIS — R0683 Snoring: Secondary | ICD-10-CM | POA: Insufficient documentation

## 2021-08-07 ENCOUNTER — Other Ambulatory Visit: Payer: Self-pay

## 2021-08-24 ENCOUNTER — Telehealth: Payer: Self-pay | Admitting: Family Medicine

## 2021-08-24 NOTE — Telephone Encounter (Signed)
Patient dropped off health information form for provider to complete.  Patient requested to be called at [phone] when form is completed and will pick it up.  Placed in provider's folder.

## 2021-08-28 NOTE — Telephone Encounter (Signed)
I am unable to fill out anything regarding learning disabilities for this. We do not diagnose adult learning disabilities here. I'm unclear based on this form if they need to me write something or if they want medical records.

## 2021-08-28 NOTE — Telephone Encounter (Signed)
Called and LVM asking for patient to please return my call. Need more information from the patient as to what is needed on the form. Does he need medical records? What is this for?

## 2021-08-28 NOTE — Telephone Encounter (Signed)
Pt requested a call back with an update on the forms, please advise.

## 2021-08-28 NOTE — Telephone Encounter (Signed)
Pt called back says doesn't have any learning disablities and anything that applies Dr would just put N/A

## 2021-08-29 NOTE — Telephone Encounter (Signed)
Pt states that form is for clearance to join a [employer]. He states that the learning disabilities section can just be filled out as N/A and he basically needs signature from provider. He does not need any medical records at this time.

## 2021-12-20 ENCOUNTER — Encounter: Payer: Self-pay | Admitting: *Deleted

## 2021-12-20 ENCOUNTER — Other Ambulatory Visit: Payer: Self-pay

## 2021-12-20 ENCOUNTER — Observation Stay
Admission: EM | Admit: 2021-12-20 | Discharge: 2021-12-21 | Disposition: A | Discharge status: Home / Self Care | Payer: BC Managed Care – PPO | Attending: Internal Medicine | Admitting: Internal Medicine

## 2021-12-20 ENCOUNTER — Emergency Department: Payer: BC Managed Care – PPO

## 2021-12-20 DIAGNOSIS — Z87891 Personal history of nicotine dependence: Secondary | ICD-10-CM | POA: Diagnosis not present

## 2021-12-20 DIAGNOSIS — R200 Anesthesia of skin: Secondary | ICD-10-CM | POA: Diagnosis present

## 2021-12-20 DIAGNOSIS — G463 Brain stem stroke syndrome: Secondary | ICD-10-CM | POA: Diagnosis not present

## 2021-12-20 DIAGNOSIS — E785 Hyperlipidemia, unspecified: Secondary | ICD-10-CM | POA: Diagnosis not present

## 2021-12-20 DIAGNOSIS — I639 Cerebral infarction, unspecified: Secondary | ICD-10-CM | POA: Diagnosis not present

## 2021-12-20 DIAGNOSIS — E119 Type 2 diabetes mellitus without complications: Secondary | ICD-10-CM

## 2021-12-20 DIAGNOSIS — E1129 Type 2 diabetes mellitus with other diabetic kidney complication: Secondary | ICD-10-CM

## 2021-12-20 DIAGNOSIS — E782 Mixed hyperlipidemia: Secondary | ICD-10-CM

## 2021-12-20 DIAGNOSIS — I6930 Unspecified sequelae of cerebral infarction: Secondary | ICD-10-CM | POA: Diagnosis present

## 2021-12-20 DIAGNOSIS — E1159 Type 2 diabetes mellitus with other circulatory complications: Secondary | ICD-10-CM

## 2021-12-20 DIAGNOSIS — M545 Low back pain, unspecified: Secondary | ICD-10-CM

## 2021-12-20 LAB — BASIC METABOLIC PANEL
Anion gap: 8 (ref 5–15)
BUN: 13 mg/dL (ref 6–20)
CO2: 27 mmol/L (ref 22–32)
Calcium: 8.7 mg/dL — ABNORMAL LOW (ref 8.9–10.3)
Chloride: 103 mmol/L (ref 98–111)
Creatinine, Ser: 0.93 mg/dL (ref 0.61–1.24)
GFR, Estimated: >60 mL/min (ref >60)
Glucose, Bld: 147 mg/dL — ABNORMAL HIGH (ref 70–99)
Potassium: 3.7 mmol/L (ref 3.5–5.1)
Sodium: 138 mmol/L (ref 135–145)

## 2021-12-20 LAB — CBC
HCT: 39.1 % (ref 39.0–52.0)
Hemoglobin: 12.8 g/dL — ABNORMAL LOW (ref 13.0–17.0)
MCH: 27.2 pg (ref 26.0–34.0)
MCHC: 32.7 g/dL (ref 30.0–36.0)
MCV: 83.2 fL (ref 80.0–100.0)
Platelets: 233 10*3/uL (ref 150–400)
RBC: 4.7 MIL/uL (ref 4.22–5.81)
RDW: 13.2 % (ref 11.5–15.5)
WBC: 6.5 10*3/uL (ref 4.0–10.5)
nRBC: 0 % (ref 0.0–0.2)

## 2021-12-20 LAB — TROPONIN I (HIGH SENSITIVITY)
Troponin I (High Sensitivity): 2 ng/L (ref <18)
Troponin I (High Sensitivity): 3 ng/L (ref <18)

## 2021-12-20 IMAGING — CT CT HEAD W/O CM
4 series · 15 of 47 positions shown, 17 images · non-contrast
Comparison: None

CLINICAL DATA: Dizziness, persistent//recurrent, cardiac or
vascular cause suspected. Numbness in the right hand, neck and body
for 3 days.



[Series 2: head wo · axial · 0.47mm/px · z∈[-81,+39]mm · 7 of 33 slices shown, 9 images]
[im 5/33  brain]
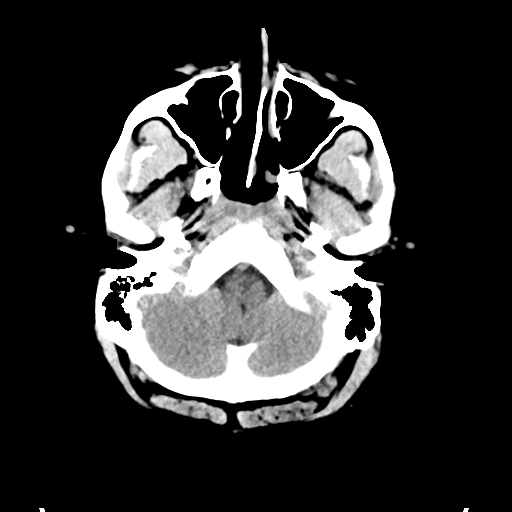
[im 5/33  bone]
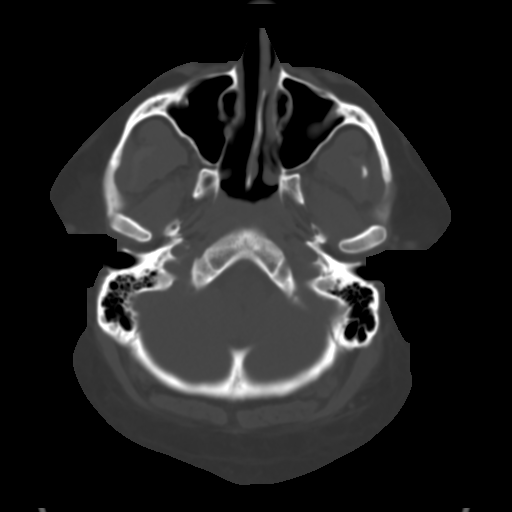
[im 9/33  brain]
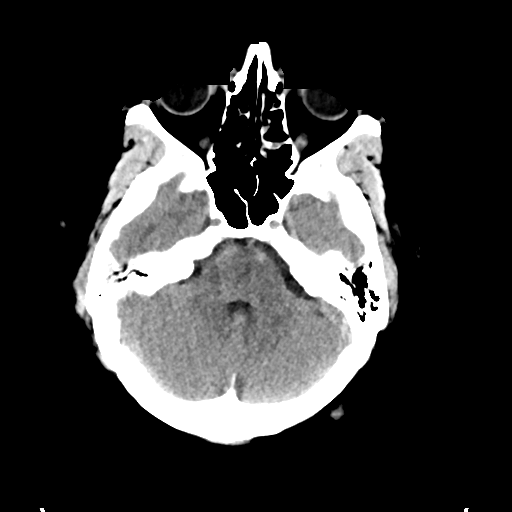
[im 13/33  brain]
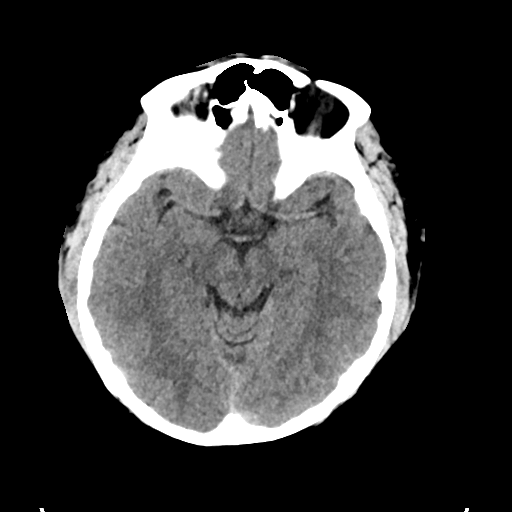
[im 17/33  brain]
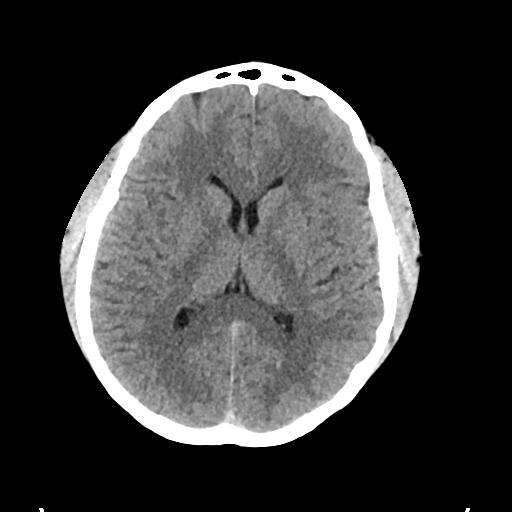
[im 21/33  brain]
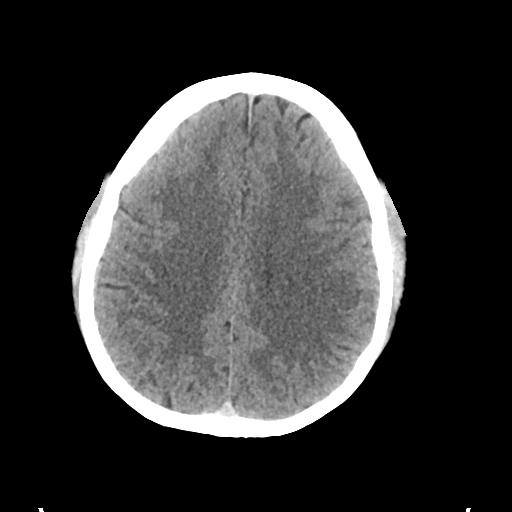
[im 21/33  bone]
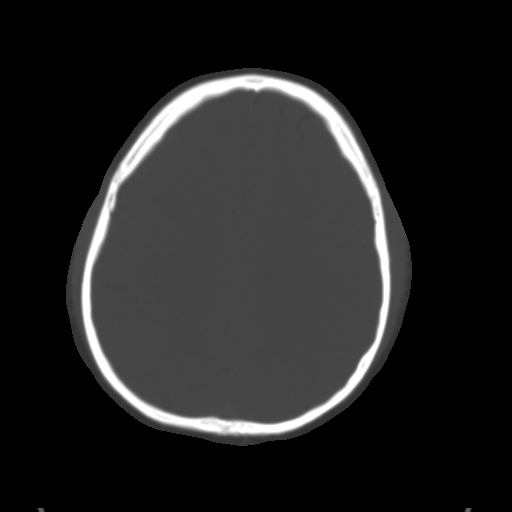
[im 25/33  brain]
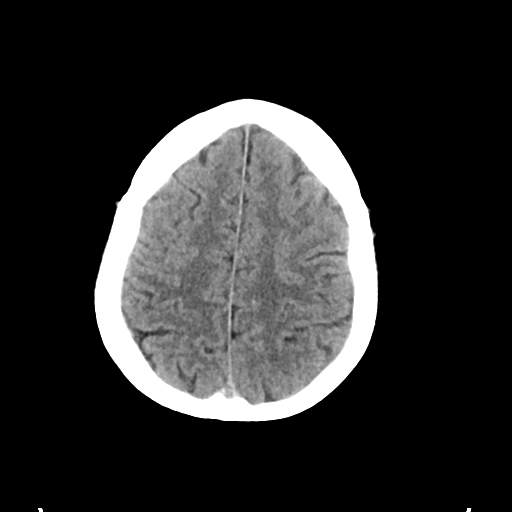
[im 29/33  brain]
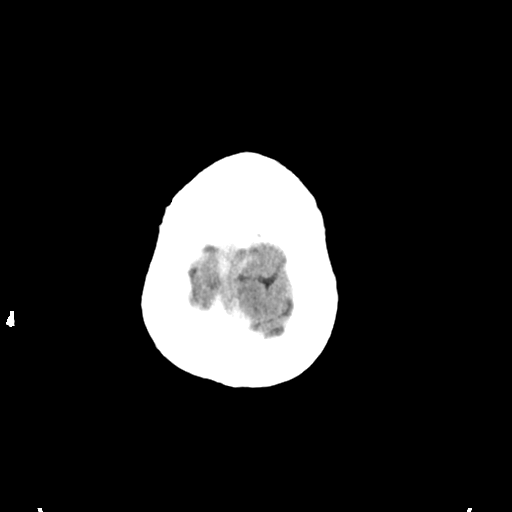

[Series 3: head bone · axial · 0.47mm/px · z∈[-85,-69]mm · 2 of 83 slices shown]
[im 9/83  bone]
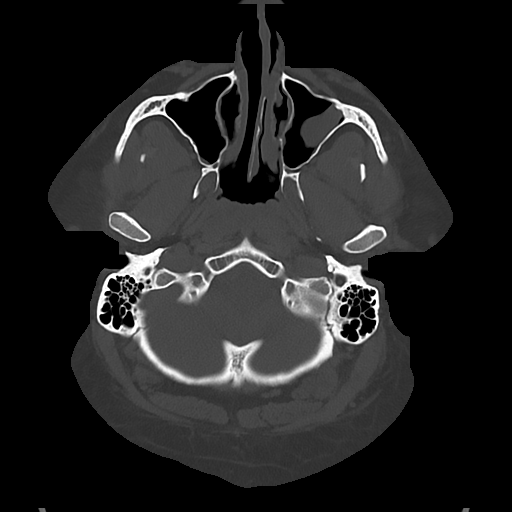
[im 17/83  bone]
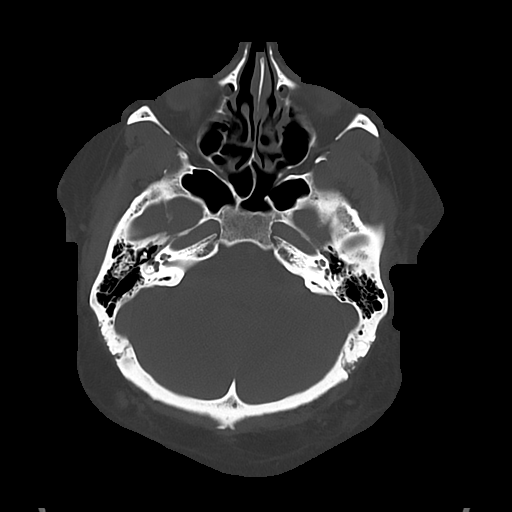

[Series 4: cor soft · coronal · 0.34mm/px · 3 of 72 slices shown]
[im 24/72  brain]
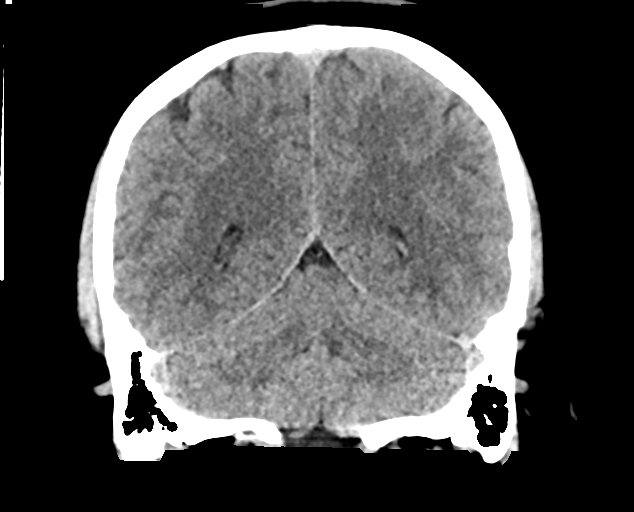
[im 32/72  brain]
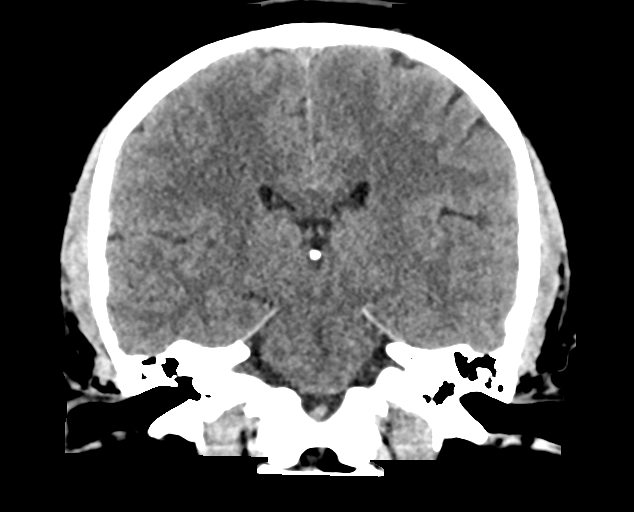
[im 40/72  brain]
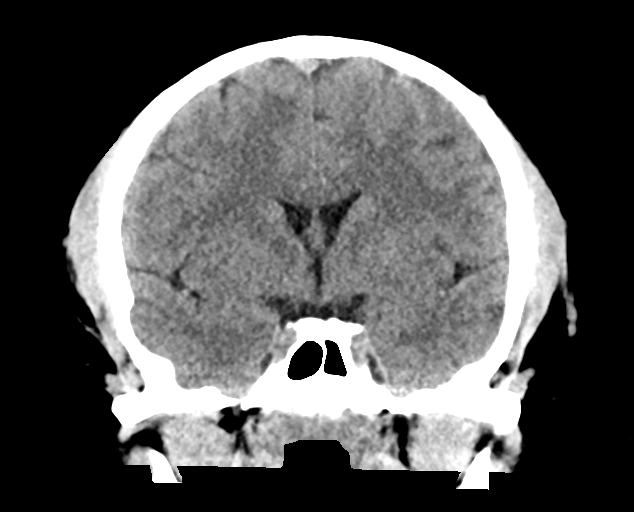

[Series 5: sag soft · sagittal · 0.32mm/px · 3 of 67 slices shown]
[im 23/67  brain]
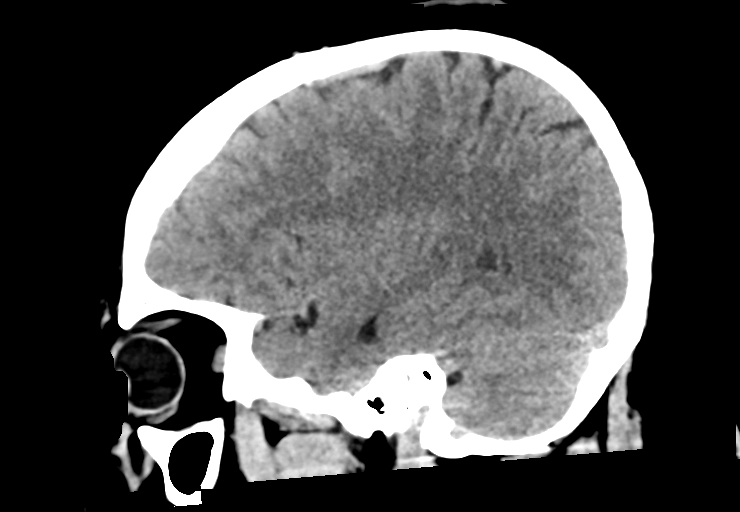
[im 34/67  brain]
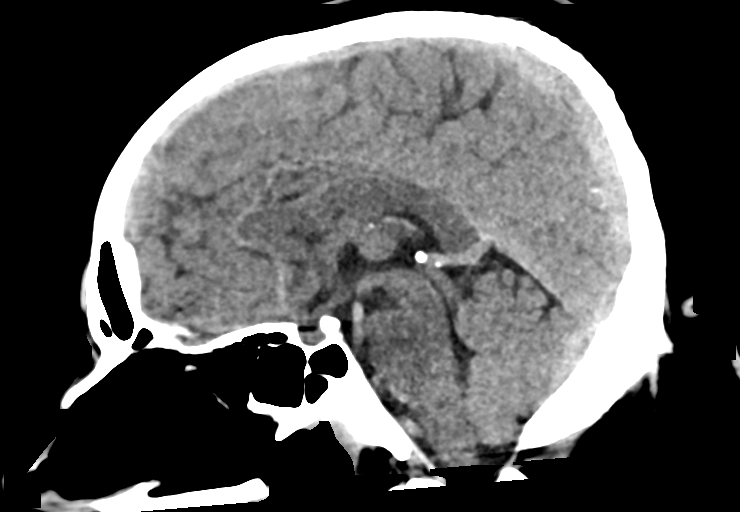
[im 45/67  brain]
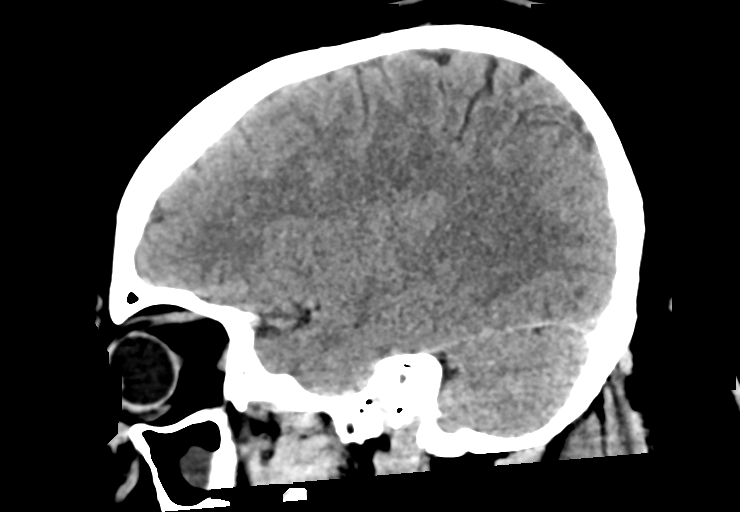

[15 of 47 positions shown; findings below may reference images not displayed]

FINDINGS: Brain: The brain shows a normal appearance without evidence of
malformation, atrophy, old or acute small or large vessel
infarction, mass lesion, hemorrhage, hydrocephalus or extra-axial
collection. No evidence of demyelinating disease.

Vascular: No hyperdense vessel. No evidence of atherosclerotic
calcification.

Skull: Normal.  No traumatic finding.  No focal bone lesion.

Sinuses/Orbits: Sinuses are clear. Orbits appear normal. Mastoids
are clear.

Other: None significant
IMPRESSION: Normal head CT.

## 2021-12-20 NOTE — ED Triage Notes (Signed)
Pt has numbness in right hand, neck and right side of body for 3days.  No headache.  No n/v/d.  Pt alert, speech clear.  No chest pain or sob.

## 2021-12-20 NOTE — ED Provider Notes (Signed)
[facility] Provider Note    Event Date/Time   First MD Initiated Contact with Patient 12/20/21 2315     (approximate)   History   Chief Complaint Numbness   HPI  Jimmy Flores is a 36 y.o. male ***       Physical Exam   Triage Vital Signs: ED Triage Vitals  Enc Vitals Group     BP 12/20/21 1851 133/72     Pulse Rate 12/20/21 1851 94     Resp 12/20/21 1851 18     Temp 12/20/21 1851 98.2 F (36.8 C)     Temp Source 12/20/21 1851 Oral     SpO2 12/20/21 1851 96 %     Weight 12/20/21 1856 (!) 385 lb (174.6 kg)     Height 12/20/21 1856 6' (1.829 m)     Head Circumference --      Peak Flow --      Pain Score 12/20/21 1856 0     Pain Loc --      Pain Edu? --      Excl. in GC? --     Most recent vital signs: Vitals:   12/20/21 1851 12/20/21 2212  BP: 133/72 131/74  Pulse: 94 83  Resp: 18 20  Temp: 98.2 F (36.8 C)   SpO2: 96% 98%    Constitutional: Alert and oriented. Eyes: Conjunctivae are normal. Head: Atraumatic. Nose: No congestion/rhinnorhea. Mouth/Throat: Mucous membranes are moist.  Neck: *** Cardiovascular: Normal rate, regular rhythm. Grossly normal heart sounds.  2+ radial pulses bilaterally. Respiratory: Normal respiratory effort.  No retractions. Lungs CTAB. Gastrointestinal: Soft and nontender. No distention. Genitourinary: *** Musculoskeletal: No lower extremity tenderness nor edema.  Neurologic:  Normal speech and language. No gross focal neurologic deficits are appreciated.    ED Results / Procedures / Treatments   Labs (all labs ordered are listed, but only abnormal results are displayed) Labs Reviewed  BASIC METABOLIC PANEL - Abnormal; Notable for the following components:      Result Value   Glucose, Bld 147 (*)    Calcium 8.7 (*)    All other components within normal limits  CBC - Abnormal; Notable for the following components:   Hemoglobin 12.8 (*)    All other components within normal limits   TROPONIN I (HIGH SENSITIVITY)  TROPONIN I (HIGH SENSITIVITY)     EKG  ED ECG REPORT I, Chesley Noon, the attending physician, personally viewed and interpreted this ECG.   Date: 12/20/2021  EKG Time: 19:05  Rate: 88  Rhythm: normal sinus rhythm  Axis: Normal  Intervals:none  ST&T Change: None  RADIOLOGY CT head reviewed and interpreted by me with no hemorrhage or midline shift.  PROCEDURES:  Critical Care performed: {CriticalCareYesNo:19197::"Yes, see critical care procedure note(s)","No"}  Procedures   MEDICATIONS ORDERED IN ED: Medications - No data to display   IMPRESSION / MDM / ASSESSMENT AND PLAN / ED COURSE  I reviewed the triage vital signs and the nursing notes.                              36 y.o. male ***   Patient's presentation is most consistent with {EM COPA:27473}  Differential diagnosis includes, but is not limited to, ***  ***  {**The patient is on the cardiac monitor to evaluate for evidence of arrhythmia and/or significant heart rate changes.**}      FINAL CLINICAL IMPRESSION(S) / ED DIAGNOSES  Final diagnoses:  None     Rx / DC Orders   ED Discharge Orders     None        Note:  This document was prepared using Dragon voice recognition software and may include unintentional dictation errors.

## 2021-12-21 ENCOUNTER — Emergency Department: Payer: BC Managed Care – PPO

## 2021-12-21 ENCOUNTER — Observation Stay
Admit: 2021-12-21 | Discharge: 2021-12-21 | Disposition: A | Discharge status: Home / Self Care | Payer: BC Managed Care – PPO | Attending: Family Medicine | Admitting: Family Medicine

## 2021-12-21 ENCOUNTER — Observation Stay: Payer: BC Managed Care – PPO

## 2021-12-21 DIAGNOSIS — I639 Cerebral infarction, unspecified: Secondary | ICD-10-CM

## 2021-12-21 DIAGNOSIS — E785 Hyperlipidemia, unspecified: Secondary | ICD-10-CM | POA: Diagnosis present

## 2021-12-21 DIAGNOSIS — E119 Type 2 diabetes mellitus without complications: Secondary | ICD-10-CM

## 2021-12-21 DIAGNOSIS — I6930 Unspecified sequelae of cerebral infarction: Secondary | ICD-10-CM | POA: Diagnosis present

## 2021-12-21 DIAGNOSIS — E1129 Type 2 diabetes mellitus with other diabetic kidney complication: Secondary | ICD-10-CM

## 2021-12-21 DIAGNOSIS — R200 Anesthesia of skin: Secondary | ICD-10-CM | POA: Diagnosis not present

## 2021-12-21 LAB — HEMOGLOBIN A1C
Hgb A1c MFr Bld: 7 % — ABNORMAL HIGH (ref 4.8–5.6)
Mean Plasma Glucose: 154.2 mg/dL

## 2021-12-21 LAB — CBC
HCT: 38.4 % — ABNORMAL LOW (ref 39.0–52.0)
Hemoglobin: 12.7 g/dL — ABNORMAL LOW (ref 13.0–17.0)
MCH: 27.7 pg (ref 26.0–34.0)
MCHC: 33.1 g/dL (ref 30.0–36.0)
MCV: 83.7 fL (ref 80.0–100.0)
Platelets: 200 10*3/uL (ref 150–400)
RBC: 4.59 MIL/uL (ref 4.22–5.81)
RDW: 13.2 % (ref 11.5–15.5)
WBC: 5.3 10*3/uL (ref 4.0–10.5)
nRBC: 0 % (ref 0.0–0.2)

## 2021-12-21 LAB — BASIC METABOLIC PANEL
Anion gap: 5 (ref 5–15)
BUN: 14 mg/dL (ref 6–20)
CO2: 27 mmol/L (ref 22–32)
Calcium: 8.3 mg/dL — ABNORMAL LOW (ref 8.9–10.3)
Chloride: 106 mmol/L (ref 98–111)
Creatinine, Ser: 0.79 mg/dL (ref 0.61–1.24)
GFR, Estimated: >60 mL/min (ref >60)
Glucose, Bld: 122 mg/dL — ABNORMAL HIGH (ref 70–99)
Potassium: 3.8 mmol/L (ref 3.5–5.1)
Sodium: 138 mmol/L (ref 135–145)

## 2021-12-21 LAB — LIPID PANEL
Cholesterol: 169 mg/dL (ref 0–200)
HDL: 31 mg/dL — ABNORMAL LOW (ref >40)
LDL Cholesterol: 126 mg/dL — ABNORMAL HIGH (ref 0–99)
Total CHOL/HDL Ratio: 5.5 RATIO
Triglycerides: 58 mg/dL (ref <150)
VLDL: 12 mg/dL (ref 0–40)

## 2021-12-21 LAB — ECHOCARDIOGRAM LIMITED BUBBLE STUDY
AR max vel: 2.96 cm2
AV Area VTI: 2.55 cm2
AV Area mean vel: 3.05 cm2
AV Mean grad: 2 mmHg
AV Peak grad: 4.2 mmHg
Ao pk vel: 1.02 m/s
Area-P 1/2: 3.23 cm2
MV VTI: 2.23 cm2
S' Lateral: 3.52 cm

## 2021-12-21 LAB — HIV ANTIBODY (ROUTINE TESTING W REFLEX): HIV Screen 4th Generation wRfx: NONREACTIVE

## 2021-12-21 IMAGING — MR MR HEAD W/O CM
12 of 16 series · 39 of 48 positions shown · non-contrast
Comparison: CT from [DATE].

CLINICAL DATA: Initial evaluation for neuro deficit, stroke
suspected. Numbness in right hand, neck, and right-sided body for 3
days.

EXAM:
MRI HEAD WITHOUT CONTRAST
TECHNIQUE: Multiplanar, multiecho pulse sequences of the brain and surrounding
structures were obtained without intravenous contrast.

[Series 5: ax dwi_tracew · axial · 3.0mm · 0.65mm/px · z∈[-105,+56]mm · 4 of 100 slices shown]
[im 1/100]
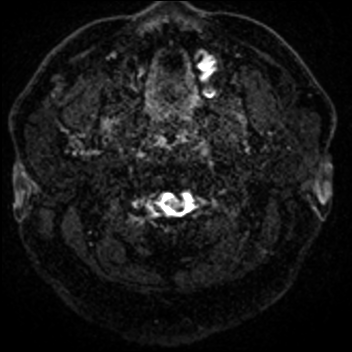
[im 34/100]
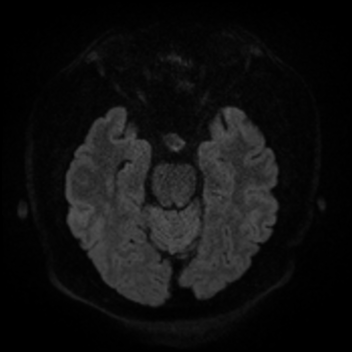
[im 67/100]
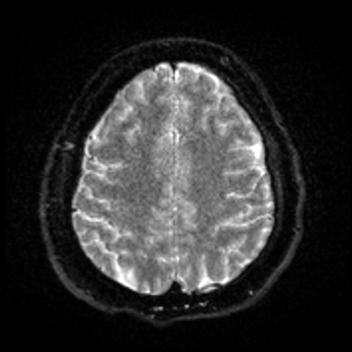
[im 100/100]
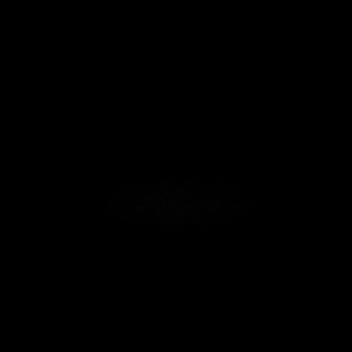

[Series 6: ax dwi_adc · axial · 3.0mm · 0.65mm/px · z∈[-105,+53]mm · 3 of 49 slices shown]
[im 1/49]
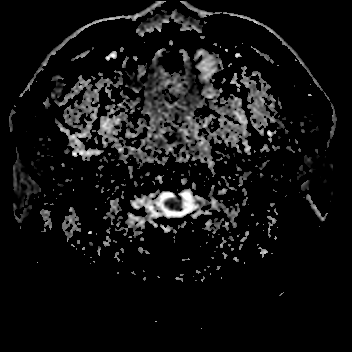
[im 25/49]
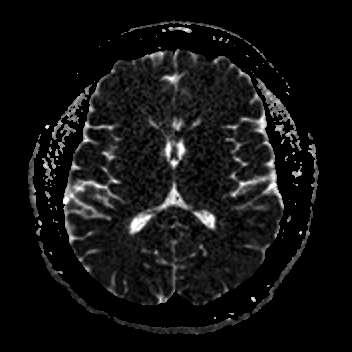
[im 49/49]
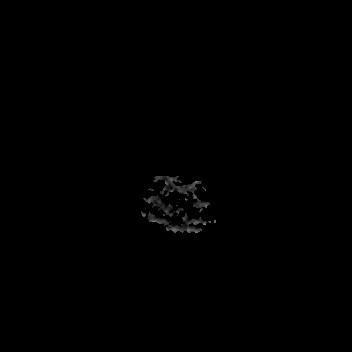

[Series 7: cor dwi_tracew · coronal · 5.0mm · 0.65mm/px · 4 of 84 slices shown]
[im 1/84]
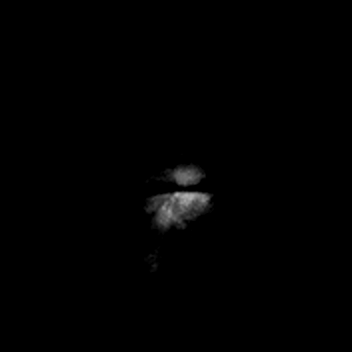
[im 28/84]
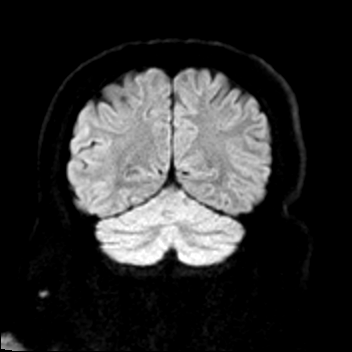
[im 56/84]
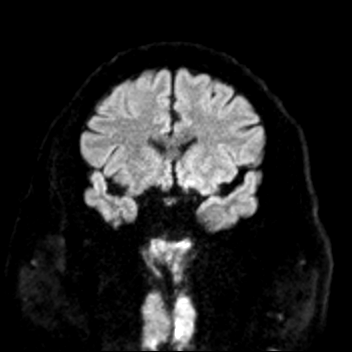
[im 84/84]
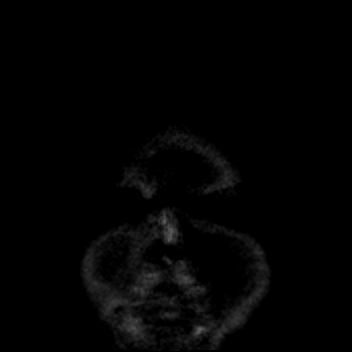

[Series 8: cor dwi_adc · coronal · 5.0mm · 0.65mm/px · 2 of 42 slices shown]
[im 1/42]
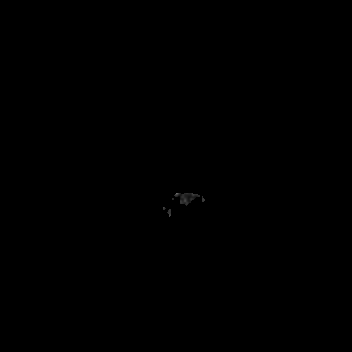
[im 42/42]
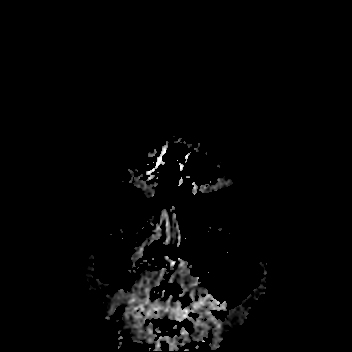

[Series 9: T1 · sagittal · 5.0mm · 0.62mm/px · 1 of 27 slices shown (1 of 2)]
[im 1/27]
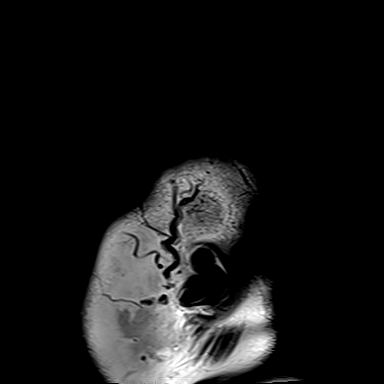

[Series 10: T2 · axial · 5.0mm · 0.53mm/px · z∈[-97,+69]mm · 2 of 29 slices shown (1 of 2)]
[im 1/29]
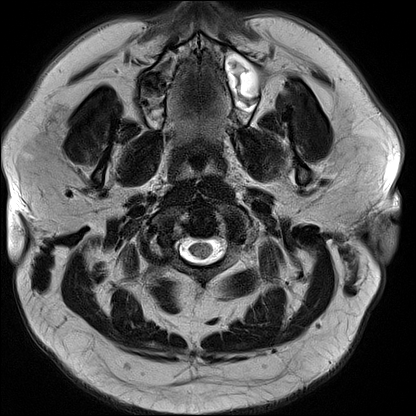
[im 29/29]
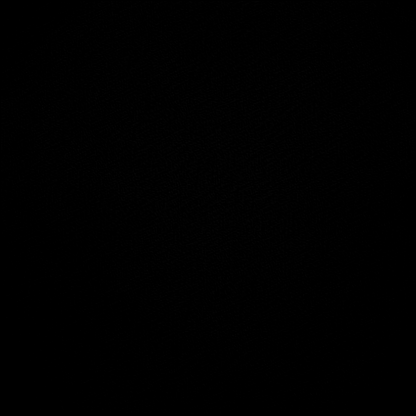

[Series 11: mag_images · axial · 3.0mm · 0.90mm/px · z∈[-101,+75]mm · 3 of 60 slices shown]
[im 1/60]
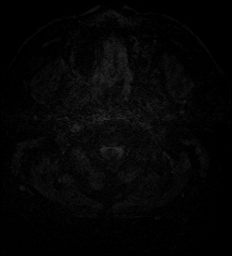
[im 30/60]
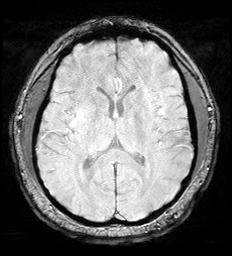
[im 60/60]
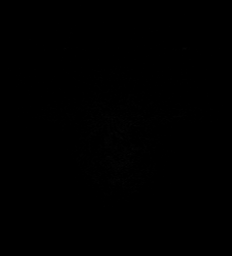

[Series 12: pha_images · axial · 3.0mm · 0.90mm/px · z∈[-101,+72]mm · 3 of 59 slices shown]
[im 1/59]
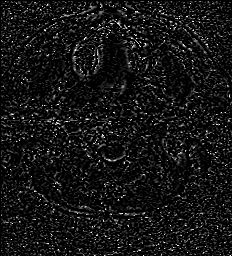
[im 30/59]
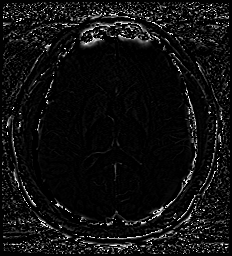
[im 59/59]
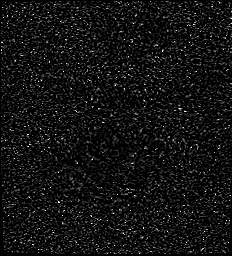

[Series 13: swi_images · axial · 3.0mm · 0.90mm/px · z∈[-101,+75]mm · 3 of 60 slices shown]
[im 1/60]
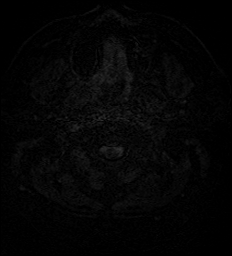
[im 30/60]
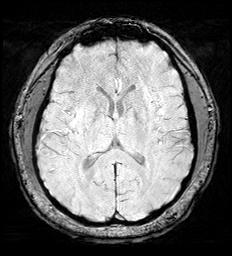
[im 60/60]
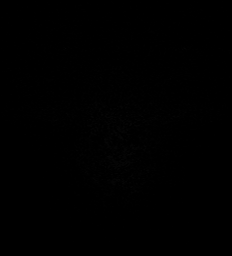

[Series 15: FLAIR · axial · 3.0mm · 0.53mm/px · z∈[-94,+66]mm · 3 of 55 slices shown]
[im 1/55]
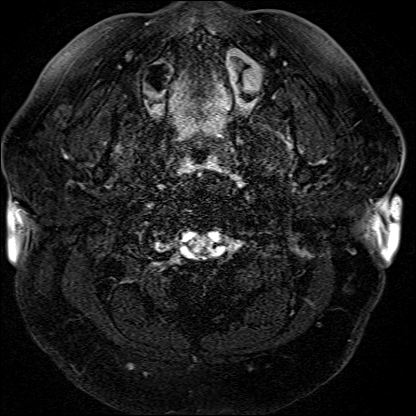
[im 28/55]
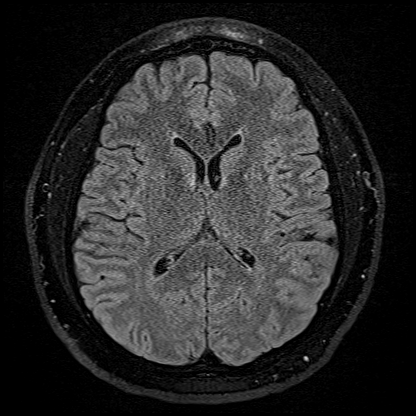
[im 55/55]
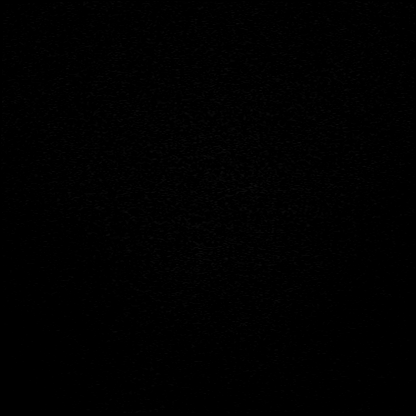

[Series 16: T1 · axial · 1.0mm · 0.98mm/px · z∈[-110,+64]mm · 9 of 176 slices shown (2 of 2)]
[im 1/176]
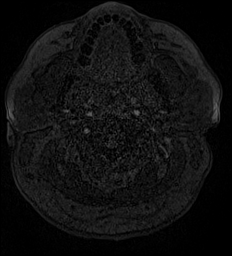
[im 22/176]
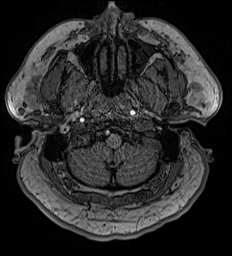
[im 44/176]
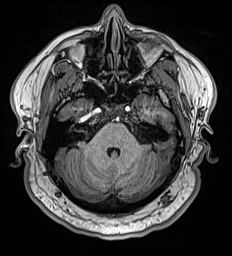
[im 66/176]
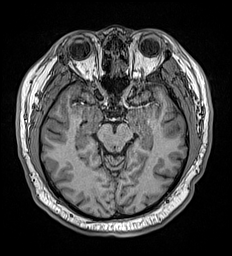
[im 88/176]
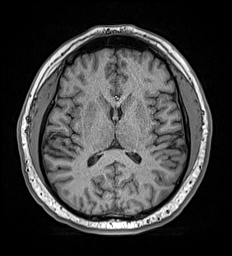
[im 110/176]
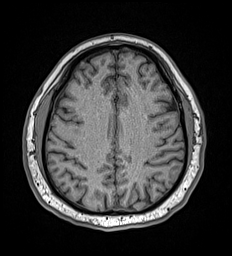
[im 132/176]
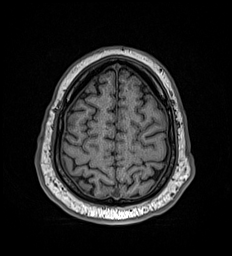
[im 154/176]
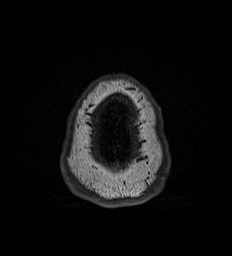
[im 176/176]
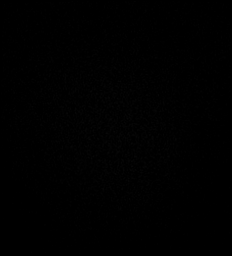

[Series 17: T2 · coronal · 5.0mm · 0.57mm/px · 2 of 32 slices shown (2 of 2)]
[im 1/32]
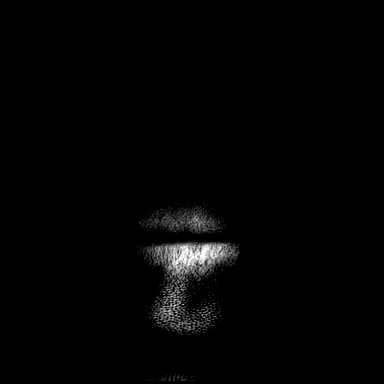
[im 32/32]
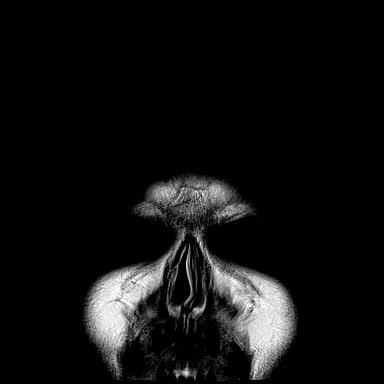

[39 of 48 positions shown; findings below may reference images not displayed]

FINDINGS: Brain: Cerebral volume within normal limits for age. No significant
cerebral white matter disease.

There is question of a subtle 3 mm focus of restricted diffusion
involving the right dorsal aspect of the lower cervicomedullary
junction (series 5, image 51). Focus of signal loss seen on
corresponding ADC w map (series 6, image 1). This is also faintly
visible on coronal DWI sequence (series 7, image 61). Probable
subtly increased T2 signal intensity noted at this level as well
(series 10, image 1). While this could potentially be artifactual,
finding is suspicious for a small ischemic infarct given the
patient's symptoms. No associated mass effect or hemorrhage.

No other evidence for acute or subacute ischemia. Gray-white matter
differentiation otherwise maintained. No encephalomalacia to suggest
chronic cortical infarction. No acute or chronic intracranial blood
products.

No mass lesion, midline shift or mass effect. Ventricles normal size
without hydrocephalus. No extra-axial fluid collection. Pituitary
gland suprasellar region within normal limits. Midline structures
intact and normal.

Vascular: Left vertebral artery hypoplastic and not well seen. Major
intracranial vascular flow voids are otherwise maintained.

Skull and upper cervical spine: Craniocervical junction within
normal limits. Bone marrow signal intensity diffusely decreased on
T1 weighted sequence, nonspecific, but most commonly related to
anemia, smoking or obesity. No scalp soft tissue abnormality.

Sinuses/Orbits: Globes and orbital soft tissues within normal
limits. Scattered mucosal thickening present throughout the
ethmoidal air cells and maxillary sinuses. No mastoid effusion.

Other: None.
IMPRESSION: 1. Subtle 3 mm focus of restricted diffusion involving the right
dorsal aspect of the lower cervicomedullary junction, suspicious for
a punctate ischemic infarct given the patient's symptoms. No
associated hemorrhage or mass effect.
2. Otherwise normal brain MRI for age.

## 2021-12-21 IMAGING — CT CT ANGIO HEAD-NECK (W OR W/O PERF)
2 of 11 series · 7 of 33 positions shown · IV contrast (APPLIED)
Comparison: Same-day brain MRI and cervical spine MRI, CT head
dated 1 day prior

CLINICAL DATA: Numbness in right hand, neck, and right-sided body
for 3 days.

EXAM:
CT ANGIOGRAPHY HEAD AND NECK
TECHNIQUE: Multidetector CT imaging of the head and neck was performed using
the standard protocol during bolus administration of intravenous
contrast. Multiplanar CT image reconstructions and MIPs were
obtained to evaluate the vascular anatomy. Carotid stenosis
measurements (when applicable) are obtained utilizing NASCET
criteria, using the distal internal carotid diameter as the
denominator.

[Series 6: cta neck/head · axial · 0.58mm/px · z∈[-232,-108]mm · 2 of 187 slices shown]
[im 63/187  soft-tissue]
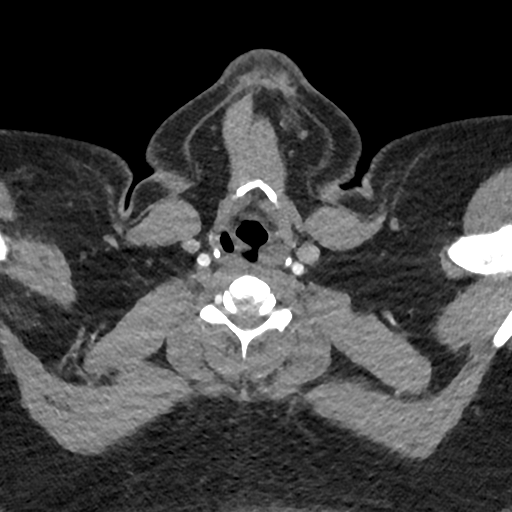
[im 125/187  soft-tissue]
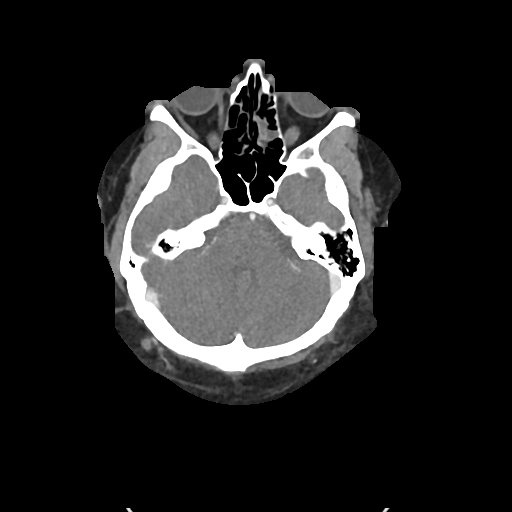

[Series 12: ax thins · axial · 0.53mm/px · z∈[-339,-78]mm · 5 of 396 slices shown]
[im 66/396  soft-tissue]
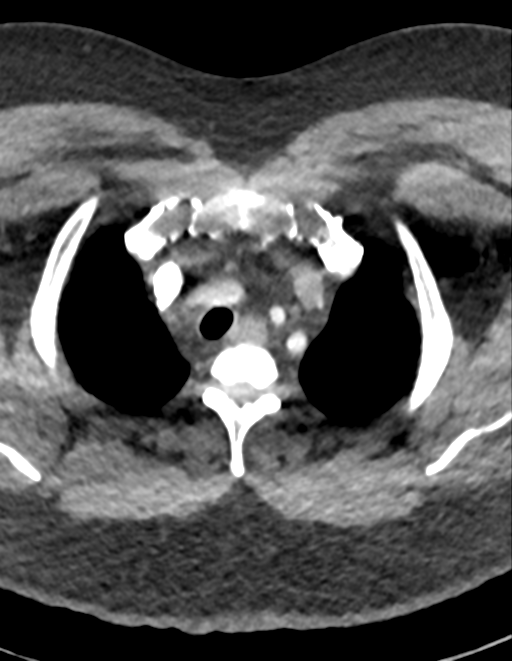
[im 132/396  bone]
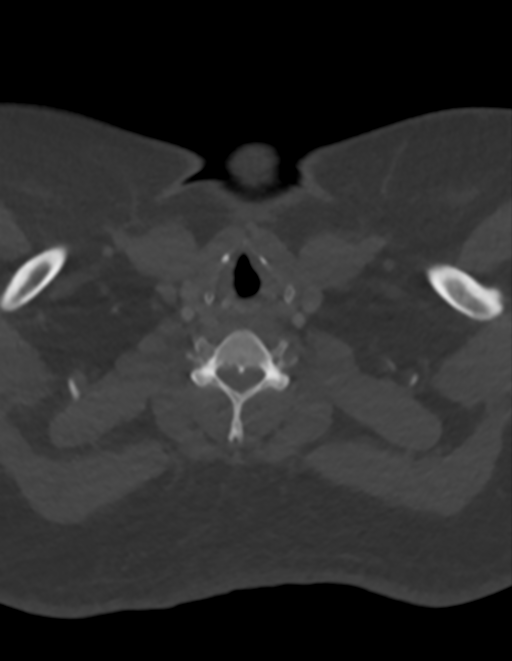
[im 198/396  soft-tissue]
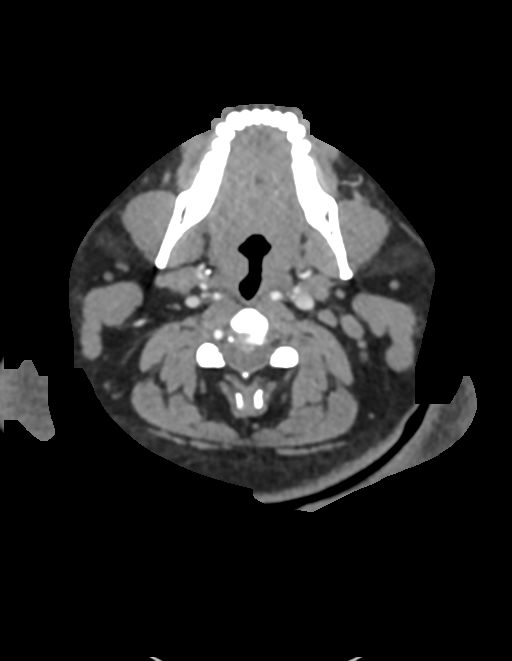
[im 264/396  bone]
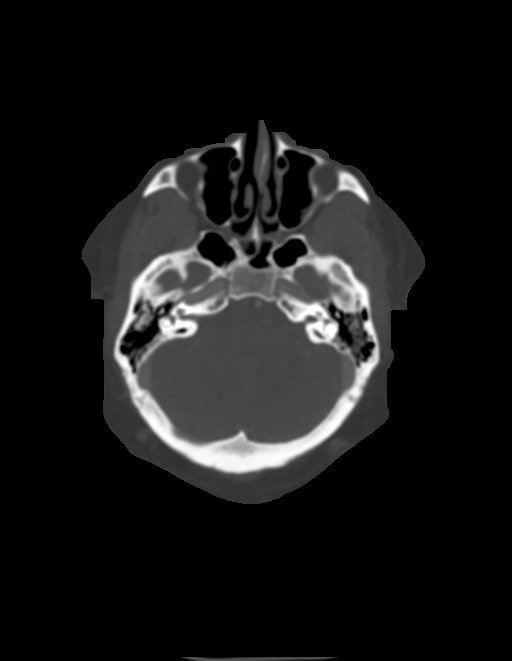
[im 330/396  soft-tissue]
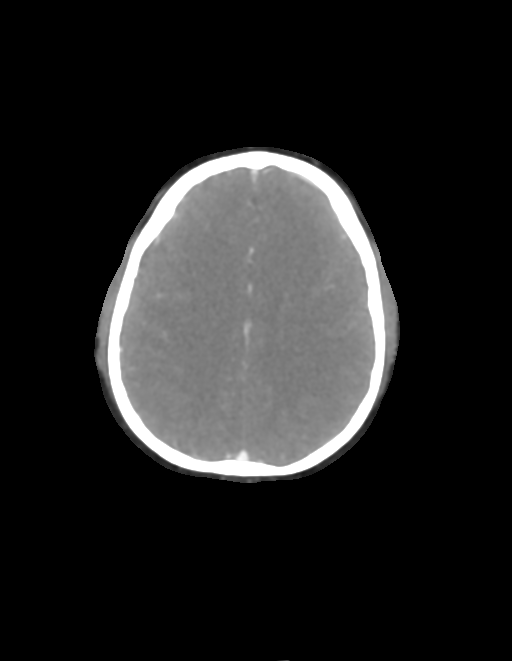

[7 of 33 positions shown; findings below may reference images not displayed]

RADIATION DOSE REDUCTION: This exam was performed according to the
departmental dose-optimization program which includes automated
exposure control, adjustment of the mA and/or kV according to
patient size and/or use of iterative reconstruction technique.

CONTRAST:  75mL OMNIPAQUE IOHEXOL 350 MG/ML SOLN
FINDINGS: CT HEAD FINDINGS

Brain: There is no acute intracranial hemorrhage, extra-axial fluid
collection, or acute infarct. The small infarcts seen in the right
aspect of the cervicomedullary junction on the prior brain MRI is
not seen on the current study.

Parenchymal volume is normal. The ventricles are normal in size.
Gray-white differentiation is preserved.

There is no mass lesion.  There is no mass effect or midline shift.

Vascular: See below.

Skull: Normal. Negative for fracture or focal lesion.

Sinuses: There is mucosal thickening in the left maxillary sinus and
ethmoid air cells.

Orbits: Globes and orbits are unremarkable.

Review of the MIP images confirms the above findings

CTA NECK FINDINGS

Aortic arch: The imaged aortic arch is normal. The origins of the
major branch vessels are patent. The subclavian arteries are patent
to the level imaged.

Right carotid system: Right common, internal, and external carotid
arteries are patent, without hemodynamically significant stenosis or
occlusion. There is no dissection or aneurysm.

Left carotid system: Left common, internal, and external carotid
arteries are patent, without hemodynamically significant stenosis or
occlusion. There is no dissection or aneurysm.

Vertebral arteries: Left vertebral artery is not well seen
throughout the neck, favored congenital hypoplasia given small
transverse foramina. The right vertebral artery is widely patent
throughout the neck.

Skeleton: There is no acute osseous abnormality or suspicious
osseous lesion. Multilevel degenerative changes in the cervical
spine are detailed on the prior cervical spine MRI report. There is
no visible canal hematoma.

Other neck: Soft tissues of the neck are unremarkable.

Upper chest: There is a 4 mm nodule in the right upper lobe.

Review of the MIP images confirms the above findings

CTA HEAD FINDINGS

Anterior circulation: The intracranial ICAs are patent.

The bilateral MCAs are patent.

The bilateral ACAs are patent. The anterior communicating artery is
normal.

There is no aneurysm or AVM.

Posterior circulation: The left V4 segment is not identified. A
suspected diminutive left PICA is seen supplied by the right V4
segment. A small right PICA is also seen. The basilar artery is
patent.

The bilateral PCAs are patent. Bilateral posterior communicating
arteries are identified.

There is no aneurysm or AVM.

Venous sinuses: Patent.

Anatomic variants: As above.

Review of the MIP images confirms the above findings
IMPRESSION: 1. Unremarkable noncontrast head CT. The small infarct in the right
aspect of the cervicomedullary junction seen on the prior MRI is not
seen on the current study.
2. The left vertebral artery is markedly diminutive and not well
seen throughout the neck, favored congenital hypoplasia given
corresponding small transverse foramina.
3. Otherwise, patent and normal vasculature of the head and neck.
4. 4 mm nodule in the right upper lobe. If the patient is at high
risk for malignancy, noncontrast head CT in 12 months is considered
optional.

## 2021-12-21 IMAGING — MR MR CERVICAL SPINE W/O CM
5 series · 36 of 48 positions shown · non-contrast
Comparison: None Available.

CLINICAL DATA: Initial evaluation for numbness in right hand, neck,
and right-sided body for 3 days.

EXAM:
MRI CERVICAL SPINE WITHOUT CONTRAST
TECHNIQUE: Multiplanar, multisequence MR imaging of the cervical spine was
performed. No intravenous contrast was administered.

[Series 10: T2 · sagittal · 3.0mm · 0.62mm/px · 6 of 18 slices shown (1 of 2)]
[im 1/18]
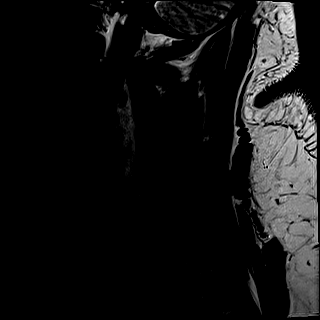
[im 4/18]
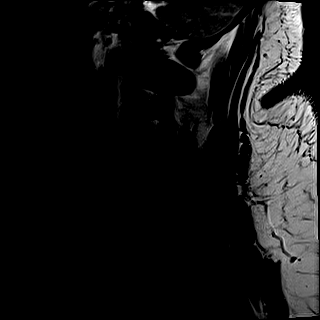
[im 7/18]
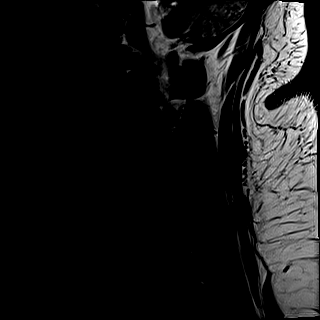
[im 11/18]
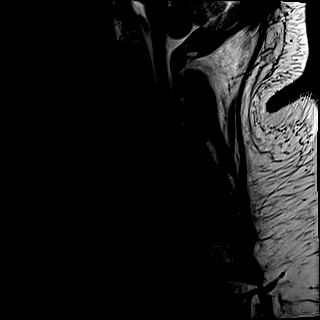
[im 14/18]
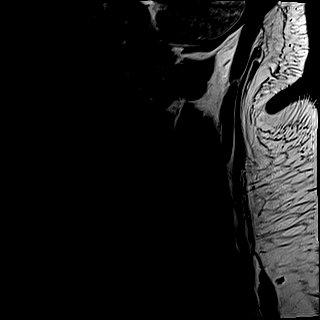
[im 18/18]
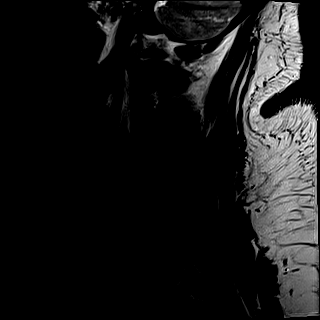

[Series 11: FLAIR · sagittal · 3.0mm · 0.78mm/px · 6 of 18 slices shown]
[im 1/18]
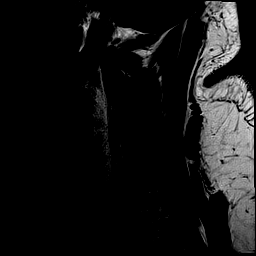
[im 4/18]
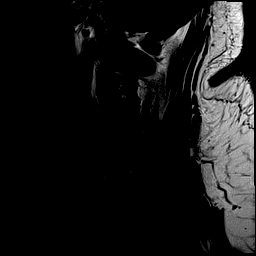
[im 7/18]
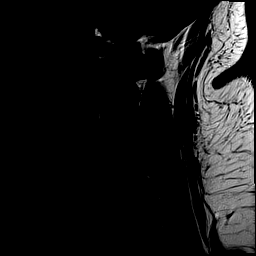
[im 11/18]
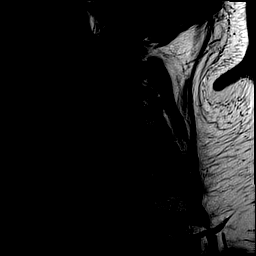
[im 14/18]
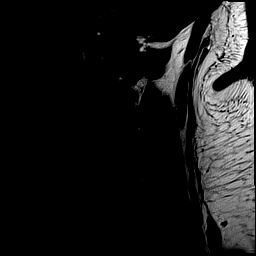
[im 18/18]
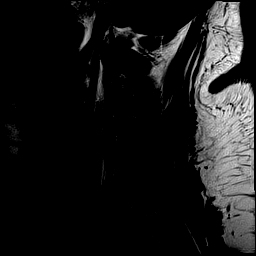

[Series 12: STIR · sagittal · 3.0mm · 0.62mm/px · 6 of 18 slices shown]
[im 1/18]
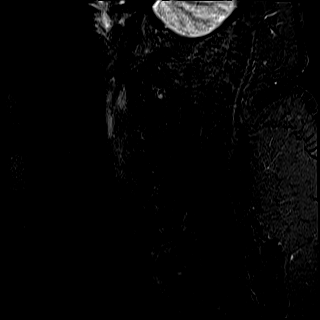
[im 4/18]
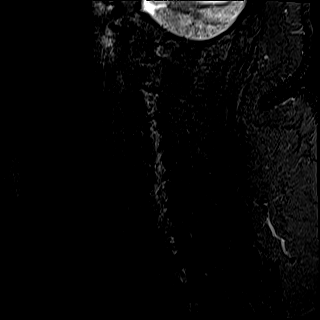
[im 7/18]
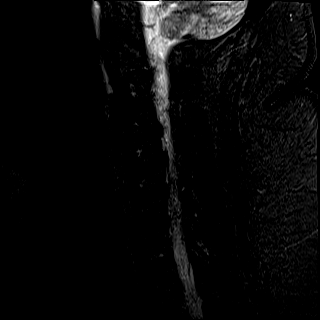
[im 11/18]
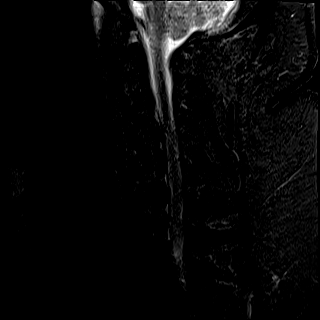
[im 14/18]
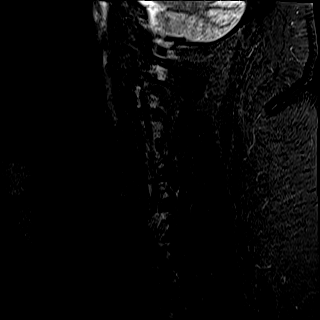
[im 18/18]
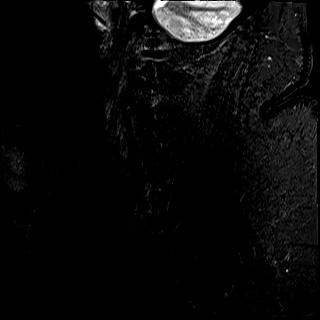

[Series 13: T2 · axial · 3.0mm · 0.70mm/px · z∈[-269,-115]mm · 10 of 45 slices shown (2 of 2)]
[im 1/45]
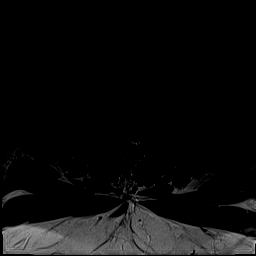
[im 4/45]
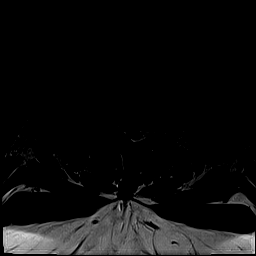
[im 7/45]
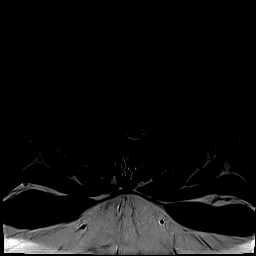
[im 13/45]
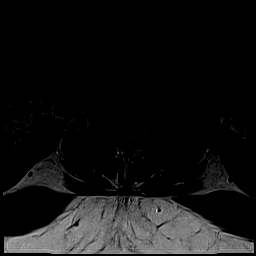
[im 19/45]
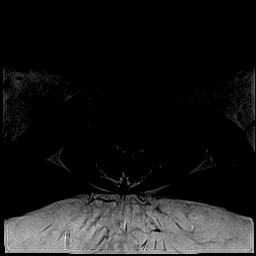
[im 23/45]
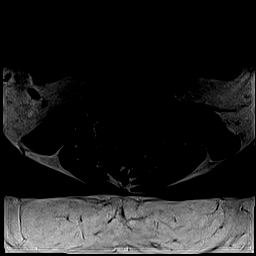
[im 26/45]
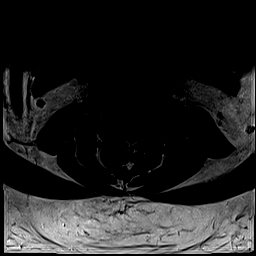
[im 32/45]
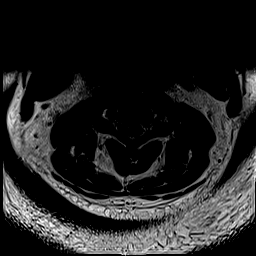
[im 38/45]
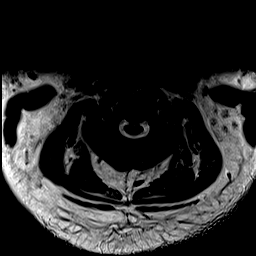
[im 45/45]
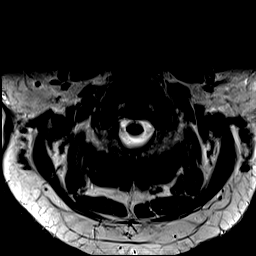

[Series 14: ax mpgr · axial · 3.0mm · 0.35mm/px · z∈[-269,-115]mm · 8 of 45 slices shown]
[im 1/45]
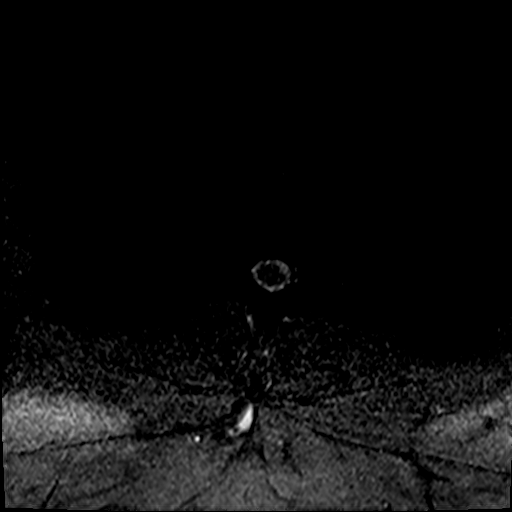
[im 7/45]
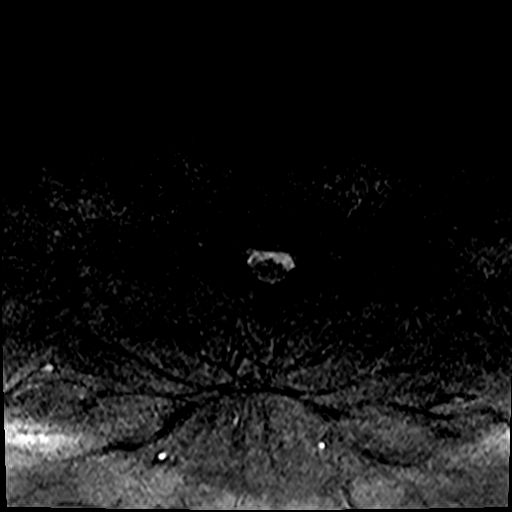
[im 13/45]
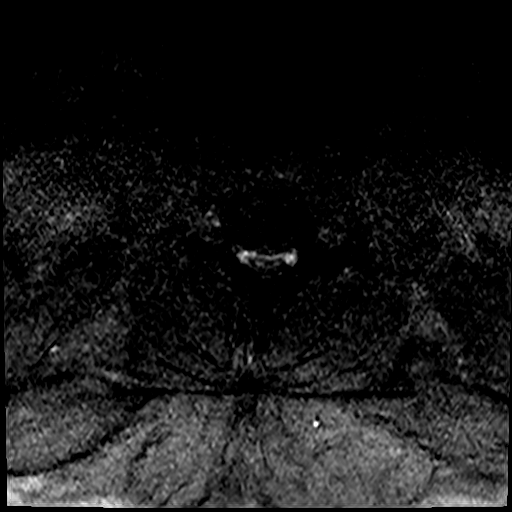
[im 19/45]
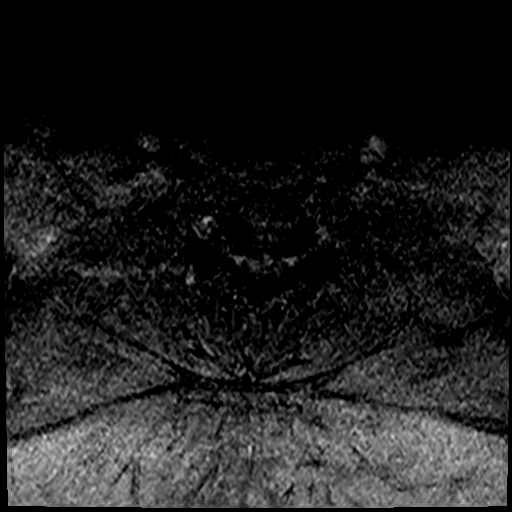
[im 26/45]
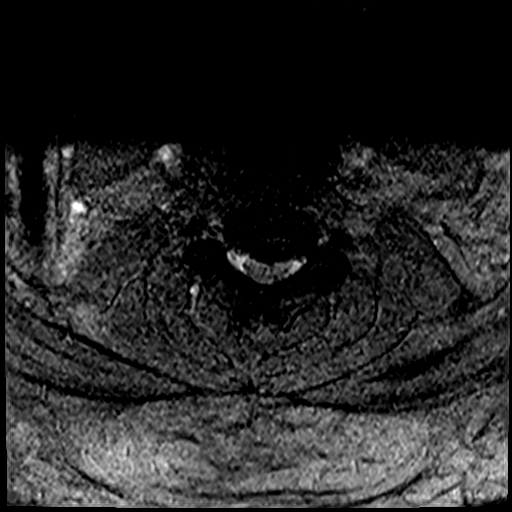
[im 32/45]
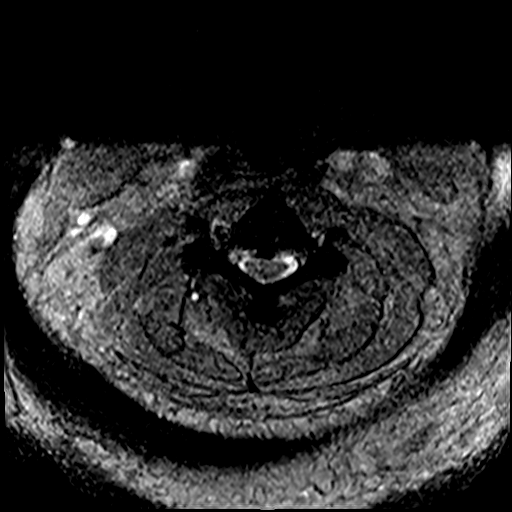
[im 38/45]
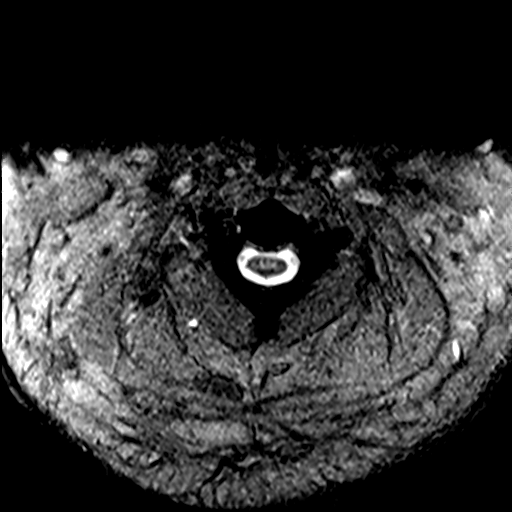
[im 45/45]
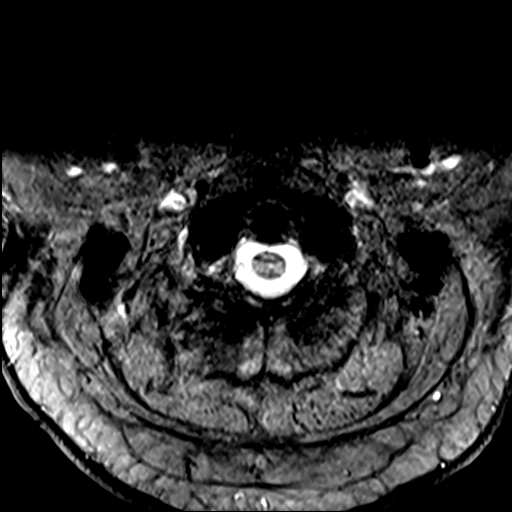

[36 of 48 positions shown; findings below may reference images not displayed]

FINDINGS: Alignment: Examination degraded by motion artifact and habitus.

Straightening with mild reversal of the normal cervical lordosis. No
listhesis.

Vertebrae: Vertebral body height maintained without acute or chronic
fracture. Bone marrow signal intensity diffusely decreased on T1
weighted sequence, nonspecific, but most commonly related to anemia,
smoking or obesity. No worrisome osseous lesions. No abnormal marrow
edema.

Cord: Grossly normal signal and morphology on this somewhat
technically limited exam.

Posterior Fossa, vertebral arteries, paraspinal tissues:
Craniocervical junction within normal limits. Paraspinous soft
tissues demonstrate no acute or significant finding. Hypoplastic
left vertebral artery not well seen. Well preserved flow void noted
within the right vertebral artery.

Disc levels:

C2-C3: Central disc protrusion indents the ventral thecal sac.
Minimal flattening of the ventral cord without cord signal changes.
Mild spinal stenosis. Foramina remain patent.

C3-C4: Disc bulge with left greater than right uncovertebral
spurring. Superimposed central to right paracentral disc protrusion
indents the ventral thecal sac. Mild cord flattening without cord
signal changes. Mild to moderate spinal stenosis. Mild to moderate
left C4 foraminal narrowing. Right neural foramen is patent.

C4-C5: Broad based left paracentral disc protrusion indents and
largely effaces the ventral thecal sac. Secondary cord flattening
without cord signal changes. Resultant fairly severe spinal stenosis
with the thecal sac measuring 6 mm in AP diameter at its most narrow
point. Bilateral uncovertebral spurring with resultant moderate left
with mild right C5 foraminal narrowing.

C5-C6: Left paracentral disc protrusion indents the ventral thecal
sac (series 13, image 26). Secondary cord flattening without cord
signal changes. Moderate to severe spinal stenosis. Superimposed
bilateral uncovertebral spurring with resultant mild left C6
foraminal narrowing. Right neural foramen remains patent.

C6-C7: Shallow central disc protrusion indents the ventral thecal
sac (series 13, image 32). No significant spinal stenosis.
Left-sided uncovertebral spurring with resultant mild left C7
foraminal stenosis. Right neural foramen is patent.

C7-T1:  Unremarkable.

Visualized upper thoracic spine demonstrates no significant finding.
IMPRESSION: 1. Multilevel cervical spondylosis with resultant moderate to severe
spinal stenosis at C3-4 through C5-6.
2. Multifactorial degenerative changes with resultant multilevel
foraminal narrowing as above. Notable findings include mild to
moderate left C4 and C5 foraminal stenosis, with mild left C6 and C7
foraminal narrowing.

## 2021-12-21 MED ORDER — ATORVASTATIN CALCIUM 20 MG PO TABS
20.0000 mg | ORAL_TABLET | Freq: Every day | ORAL | Status: DC
  Administered 2021-12-21: 20 mg via ORAL
  Filled 2021-12-21: qty 1

## 2021-12-21 MED ORDER — STROKE: EARLY STAGES OF RECOVERY BOOK: Freq: Once | Status: AC

## 2021-12-21 MED ORDER — LORAZEPAM 1 MG PO TABS
1.0000 mg | ORAL_TABLET | Freq: Once | ORAL | Status: AC
  Administered 2021-12-21: 1 mg via ORAL
  Filled 2021-12-21: qty 1

## 2021-12-21 MED ORDER — LIVING WELL WITH DIABETES BOOK
Freq: Once | Status: AC
Start: 2021-12-21 — End: 2021-12-21
  Filled 2021-12-21: qty 1

## 2021-12-21 MED ORDER — TRAZODONE HCL 50 MG PO TABS: 25.0000 mg | ORAL_TABLET | Freq: Every evening | ORAL | Status: DC | PRN

## 2021-12-21 MED ORDER — CLOPIDOGREL BISULFATE 75 MG PO TABS
75.0000 mg | ORAL_TABLET | Freq: Every day | ORAL | Status: DC
  Administered 2021-12-21: 75 mg via ORAL
  Filled 2021-12-21: qty 1

## 2021-12-21 MED ORDER — ATORVASTATIN CALCIUM 20 MG PO TABS: 20.0000 mg | ORAL_TABLET | Freq: Every day | ORAL | 0 refills | Status: DC

## 2021-12-21 MED ORDER — ACETAMINOPHEN 325 MG PO TABS: 650.0000 mg | ORAL_TABLET | Freq: Four times a day (QID) | ORAL | Status: DC | PRN

## 2021-12-21 MED ORDER — ONDANSETRON HCL 4 MG/2ML IJ SOLN: 4.0000 mg | Freq: Four times a day (QID) | INTRAMUSCULAR | Status: DC | PRN

## 2021-12-21 MED ORDER — ASPIRIN 81 MG PO CHEW
324.0000 mg | CHEWABLE_TABLET | Freq: Once | ORAL | Status: AC
  Administered 2021-12-21: 324 mg via ORAL
  Filled 2021-12-21: qty 4

## 2021-12-21 MED ORDER — CLOPIDOGREL BISULFATE 75 MG PO TABS: 75.0000 mg | ORAL_TABLET | Freq: Every day | ORAL | 0 refills | Status: DC

## 2021-12-21 MED ORDER — METFORMIN HCL 1000 MG PO TABS: 1000.0000 mg | ORAL_TABLET | Freq: Two times a day (BID) | ORAL | 0 refills | Status: DC

## 2021-12-21 MED ORDER — BLOOD GLUCOSE MONITOR KIT
PACK | 0 refills | Status: AC
Start: 2021-12-21 — End: ?

## 2021-12-21 MED ORDER — ENOXAPARIN SODIUM 100 MG/ML IJ SOSY
0.5000 mg/kg | PREFILLED_SYRINGE | INTRAMUSCULAR | Status: DC
  Administered 2021-12-21: 87.5 mg via SUBCUTANEOUS
  Filled 2021-12-21: qty 0.88

## 2021-12-21 MED ORDER — IOHEXOL 350 MG/ML SOLN
75.0000 mL | Freq: Once | INTRAVENOUS | Status: AC | PRN
  Administered 2021-12-21: 75 mL via INTRAVENOUS

## 2021-12-21 MED ORDER — LISINOPRIL 5 MG PO TABS
5.0000 mg | ORAL_TABLET | Freq: Every day | ORAL | 0 refills | Status: DC
Start: 2021-12-21 — End: 2022-01-10

## 2021-12-21 MED ORDER — ADULT MULTIVITAMIN W/MINERALS CH
ORAL_TABLET | Freq: Every day | ORAL | Status: DC
  Administered 2021-12-21: 1 via ORAL
  Filled 2021-12-21: qty 1

## 2021-12-21 MED ORDER — ASPIRIN 81 MG PO TBEC: 81.0000 mg | DELAYED_RELEASE_TABLET | Freq: Every day | ORAL | 12 refills | Status: DC

## 2021-12-21 MED ORDER — SODIUM CHLORIDE 0.9 % IV SOLN: INTRAVENOUS | Status: DC

## 2021-12-21 MED ORDER — ONDANSETRON HCL 4 MG PO TABS: 4.0000 mg | ORAL_TABLET | Freq: Four times a day (QID) | ORAL | Status: DC | PRN

## 2021-12-21 MED ORDER — MAGNESIUM HYDROXIDE 400 MG/5ML PO SUSP: 30.0000 mL | Freq: Every day | ORAL | Status: DC | PRN

## 2021-12-21 MED ORDER — ASPIRIN 81 MG PO TBEC: 81.0000 mg | DELAYED_RELEASE_TABLET | Freq: Every day | ORAL | Status: DC

## 2021-12-21 MED ORDER — ACETAMINOPHEN 650 MG RE SUPP: 650.0000 mg | Freq: Four times a day (QID) | RECTAL | Status: DC | PRN

## 2021-12-21 MED ORDER — METFORMIN HCL 500 MG PO TABS: 1000.0000 mg | ORAL_TABLET | Freq: Two times a day (BID) | ORAL | Status: DC

## 2021-12-21 NOTE — TOC Initial Note (Addendum)
Transition of Care Jimmy Flores) - Initial/Assessment Note    Patient Details  Name: Jimmy Flores MRN: [id number] Date of Birth: 1986-01-13  Transition of Care Jimmy Flores) CM/SW Contact:    Jimmy Bay Cellar, RN Phone Number: 12/21/2021, 4:08 PM  Clinical Narrative:                 Spoke with patient wife who was at bedside. Discussed OT recommendations for OP therapy. At this time patient requested to hold off on referral and they would talk about it and follow up if needed. Patient with no current needs and strong support system. Encouraged patient and spouse to reach out with any needs or concerns. Spouse will transport patient home at discharge.   Expected Discharge Plan: Home/Self Care Barriers to Discharge: No Barriers Identified   Patient Goals and CMS Choice Patient states their goals for this hospitalization and ongoing recovery are:: Get back home      Expected Discharge Plan and Services Expected Discharge Plan: Home/Self Care       Living arrangements for the past 2 months: Single Family Home                                      Prior Living Arrangements/Services Living arrangements for the past 2 months: Single Family Home Lives with:: Spouse Patient language and need for interpreter reviewed:: Yes Do you feel safe going back to the place where you live?: Yes      Need for Family Participation in Patient Care: Yes (Comment) Care giver support system in place?: Yes (comment)   Criminal Activity/Legal Involvement Pertinent to Current Situation/Hospitalization: No - Comment as needed  Activities of Daily Living Home Assistive Devices/Equipment: None ADL Screening (condition at time of admission) Patient's cognitive ability adequate to safely complete daily activities?: Yes Is the patient deaf or have difficulty hearing?: No Does the patient have difficulty seeing, even when wearing glasses/contacts?: No Does the patient have difficulty concentrating, remembering,  or making decisions?: No Patient able to express need for assistance with ADLs?: Yes Does the patient have difficulty dressing or bathing?: No Independently performs ADLs?: Yes (appropriate for developmental age) Does the patient have difficulty walking or climbing stairs?: No Weakness of Legs: None Weakness of Arms/Hands: None  Permission Sought/Granted   Permission granted to share information with : Yes, Verbal Permission Granted  Share Information with NAME: Jimmy Flores     Permission granted to share info w Relationship: Spouse     Emotional Assessment Appearance:: Appears stated age Attitude/Demeanor/Rapport: Engaged, Ambitious Affect (typically observed): Accepting Orientation: : Oriented to Self, Oriented to Place, Oriented to Situation, Oriented to  Time Alcohol / Substance Use: Not Applicable Psych Involvement: No (comment)  Admission diagnosis:  Cerebrovascular accident (CVA), unspecified mechanism (HCC) [I63.9] Right sided numbness [R20.0] Patient Active Problem List   Diagnosis Date Noted   Right sided numbness 12/21/2021   Acute stroke of medulla oblongata (HCC) 12/21/2021   Acute right-sided low back pain without sciatica 07/03/2018   Morbid obesity (HCC) 04/10/2018   PCP:  Jimmy Carrow, DO Pharmacy:   [id number] [place], [place] - [address] AT [facility] OF [address] [place] [address] Phone: [phone] Fax: [phone]     Social Determinants of Health (SDOH) Interventions    Readmission Risk Interventions     View : No data to  display.

## 2021-12-21 NOTE — ED Notes (Addendum)
Pt to MRI

## 2021-12-21 NOTE — Progress Notes (Signed)
SLP Cancellation Note  Patient Details Name: Marven Veley MRN: [id number] DOB: 11-Dec-1985   Cancelled treatment:       Reason Eval/Treat Not Completed: SLP screened, no needs identified, will sign off (chart reviewed; consulted pt/family then NSG re: status) Pt denied any difficulty swallowing and is currently on a regular diet; tolerates swallowing pills w/ water per NSG. Pt had completed his breakfast meal but requested more water. Pt conversed in conversation w/out expressive/receptive deficits noted; pt denied any speech-language deficits. Speech clear, intelligible. Pt endorsed continued R hand numbness/tingling. No further skilled ST services indicated as pt appears at his baseline. Pt agreed. NSG to reconsult if any change in status while admitted.        Jerilynn Som, MS, CCC-SLP Speech Language Pathologist Rehab Services; [facility] [phone] (ascom) Syeda Prickett 12/21/2021, 8:38 AM

## 2021-12-21 NOTE — ED Notes (Signed)
Informed RN bed assigned 
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
## 2021-12-21 NOTE — Assessment & Plan Note (Addendum)
New diagnosis. HbA1c 7.0. Patient has had diabetic teaching. He has been taught how to use glucometer. He will be discharged on Metformin and has been asked to check his glucoses bid and record. This record is to be taken in with the patient to his follow up with PCP. He has also been started on lisinopril 5 mg daily for renal protection.

## 2021-12-21 NOTE — Plan of Care (Addendum)
  RD consulted for nutrition education. .   Lab Results  Component Value Date   HGBA1C 7.0 (H) 12/21/2021   Pt with possible new DM diagnosis.   Spoke with pt and wife at bedside. Pt reports he was told he has "pre-diabetes" per discussion with neurologist. Pt reports that he started eating more healthfully over the past week to help with weight. Pt shares that his diet and exercise regimen has been impacted secondary to [place] and having a young child who just recently started sleeping through the night. He had done this the past prior to [place] pandemic and was very successfully, losing 50 pounds ion a few months. Pt now cooks his own meals (3 meals per day (overnight oats with oats, almond milk, greek yogurt, fruits, and honey; Lunch and Dinner: meat, starch, and vegetable). Pt drinks water and diet soda. He intends to resume weight lifting at the local gym.   RD provided "Plate Method" handout from the [employer]. Discussed different food groups and their effects on blood sugar, emphasizing carbohydrate-containing foods. Provided list of carbohydrates and recommended serving sizes of common foods.  Discussed importance of controlled and consistent carbohydrate intake throughout the day. Provided examples of ways to balance meals/snacks and encouraged intake of high-fiber, whole grain complex carbohydrates. Teach back method used.  Expect fair to good compliance.  Body mass index is 52.22 kg/m. Pt meets criteria for morbid obesity, class III based on current BMI. Obesity is a complex, chronic medical condition that is optimally managed by a multidisciplinary care team. Weight loss is not an ideal goal for an acute inpatient hospitalization. However, if further work-up for obesity is warranted, consider outpatient referral to outpatient bariatric service and/or [facility].    Current diet order is Heart Healthy, patient is  consuming approximately 100% of meals at this time. Labs and medications reviewed. No further nutrition interventions warranted at this time. RD contact information provided. If additional nutrition issues arise, please re-consult RD.  Loistine Chance, RD, LDN, [place] Registered Dietitian II Certified Diabetes Care and Education Specialist Please refer to [facility] for RD and/or RD on-call/weekend/after hours pager

## 2021-12-21 NOTE — Discharge Summary (Addendum)
Physician Discharge Summary   Patient: Jimmy Flores MRN: [id number] DOB: 1986/06/24  Admit date:     12/20/2021  Discharge date: 12/21/21  Discharge Physician: Karie Kirks   PCP: Valerie Roys, DO   Recommendations at discharge:    Discharge to home. Check glucose twice a day. Once in the morning before breakfast and once in the evening before dinner. Write down these values and take in to visit with PCP. Follow up with PCP in 1-2 weeks.  If patient is not having improvement in paresthesia symptoms at time of PCP visit, he should have referral to outpatient OT.  Follow up with neurology in 3 weeks. Call for appointment. Walk for a minimum of 20 minutes a day. Increase duration of walk as tolerated.  Discharge Diagnoses: Principal Problem:   Acute stroke of medulla oblongata ([place]) Active Problems:   Right sided numbness   Morbid obesity (HCC)   DM II (diabetes mellitus, type II), controlled ([place])   Hyperlipidemia  Resolved Problems:   * No resolved hospital problems. [facility] Course: No notes on file  Assessment and Plan: * Acute stroke of medulla oblongata ([place]) - The patient was admitted to a medical telemetry observation bed. He has been evaluated by neurology He has been started on ASA and Plavix. He will continue these until he follows up with neurology as outpatient. He has been started on a statin for hyperlipidemia. His LDL is 127.  He has been diagnosed with DM II. His Hemoglobin A1c is 7.0. He has been started on metformin, and he will be checking  His glucose BID. Those records will be taken in to PCP on his visit. He has also been started on low dose lisinopril for renal protection.  Echocardiogram was obtained that demonstrated a normal EF of 28-31%, normal diastolic parameters, and no evidence of shunt or intracardiac thrombus. 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 Otherwise, patent and normal vasculature of the head and neck. MRI Brain demonstrated Subtle 3 mm focus of restricted diffusion involving the right dorsal aspect of the lower cervicomedullary junction, suspicious for a punctate ischemic infarct given the patient's symptoms. No associated hemorrhage or mass effect. Otherwise normal MRI Brain for age. MRI C-spine has demonstrated Multilevel cervical spondylosis with resultant moderate to severe spinal stenosis at C3-4 through C5-6. 2. Multifactorial degenerative changes with resultant multilevel foraminal narrowing as above. Notable findings include mild to moderate left C4 and C5 foraminal stenosis, with mild left C6 and C7 foraminal narrowing. He has been evaluated by PT/OT/SLP. Recommendation is for outpatient OT if his symptoms do not improve before he follows up with PCP.  Right sided numbness - Plan as above.  Hyperlipidemia The patient has been started on a statin.  DM II (diabetes mellitus, type II), controlled ([place]) New diagnosis. HbA1c 7.0. Patient has had diabetic teaching. He has been taught how to use glucometer. He will be discharged on Metformin and has been asked to check his glucoses bid and record. This record is to be taken in with the patient to his follow up with PCP. He has also been started on lisinopril 5 mg daily for renal protection.   I have seen and examined this patient myself. I have spent 38 in his evaluation and discharge.  Consultants: Neurology, Diabetic educator Procedures performed: None  Disposition: Home Diet recommendation:  Discharge Diet Orders (  From admission, onward)     Start     Ordered   12/21/21 0000  Diet - low sodium heart healthy        12/21/21 1634   12/21/21 0000  Diet Carb Modified        12/21/21 1634           Cardiac and Carb modified diet DISCHARGE MEDICATION: Allergies as of 12/21/2021   No Known  Allergies      Medication List     TAKE these medications    aspirin EC 81 MG tablet Take 1 tablet (81 mg total) by mouth daily. Swallow whole. Start taking on: December 22, 2021   atorvastatin 20 MG tablet Commonly known as: LIPITOR Take 1 tablet (20 mg total) by mouth daily. Start taking on: December 22, 2021   blood glucose meter kit and supplies Kit Dispense based on patient and insurance preference. Use up to four times daily as directed.   clopidogrel 75 MG tablet Commonly known as: PLAVIX Take 1 tablet (75 mg total) by mouth daily. Start taking on: December 22, 2021   lisinopril 5 MG tablet Commonly known as: ZESTRIL Take 1 tablet (5 mg total) by mouth daily.   metFORMIN 1000 MG tablet Commonly known as: GLUCOPHAGE Take 1 tablet (1,000 mg total) by mouth 2 (two) times daily with a meal. Start taking on: December 23, 2021   MULTIVITAMIN PO Take 2 tablets by mouth daily. [place]  (From admission, onward)           Start     Ordered   12/21/21 1624  DME Glucometer  Once       Comments: With 100 lancets, 100 strips.   12/21/21 1624            Follow-up Information     Johnson, Megan P, DO Follow up in 2 week(s).   Specialty: Family Medicine Contact information: [place] [address] [phone]         [facility] Follow up in 3 week(s).   Contact information: [place] [phone]               Discharge Exam: Danley Danker Weights   12/20/21 1856  Weight: (!) 174.6 kg   Vitals:   12/21/21 1241 12/21/21 1727  BP: 124/76 115/86  Pulse: 77 80  Resp: 18 18  Temp: 98.4 F (36.9 C) 98.6 F (37 C)  SpO2: 98% 98%   Exam:  Constitutional:  The patient is awake, alert, and oriented x 3. No acute distress. Respiratory:  No increased work of breathing. No wheezes, rales, or rhonchi No tactile fremitus Cardiovascular:   Regular rate and rhythm No murmurs, ectopy, or gallups. No lateral PMI. No thrills. Abdomen:  Abdomen is soft, non-tender, non-distended No hernias, masses, or organomegaly Normoactive bowel sounds.  Musculoskeletal:  No cyanosis, clubbing, or edema Skin:  No rashes, lesions, ulcers palpation of skin: no induration or nodules Neurologic:  CN 2-12 intact Numbness and tingling of right hand and upper extremity unchanged from presentation. Psychiatric:  Mental status Mood, affect appropriate Orientation to person, place, time  judgment and insight appear intact   Condition at discharge: good  The results of significant diagnostics from this hospitalization (including imaging, microbiology, ancillary and laboratory) are listed below for reference.   Imaging Studies: CT ANGIO  HEAD NECK W WO CM  Result Date: 12/21/2021 CLINICAL DATA:  Numbness in right hand, neck, and right-sided body for 3 days. EXAM: CT ANGIOGRAPHY HEAD AND NECK TECHNIQUE: Multidetector CT imaging of the head and neck was performed using the standard protocol during bolus administration of intravenous contrast. Multiplanar CT image reconstructions and MIPs were obtained to evaluate the vascular anatomy. Carotid stenosis measurements (when applicable) are obtained utilizing NASCET criteria, using the distal internal carotid diameter as the denominator. RADIATION DOSE REDUCTION: This exam was performed according to the departmental dose-optimization program which includes automated exposure control, adjustment of the mA and/or kV according to patient size and/or use of iterative reconstruction technique. CONTRAST:  45mL OMNIPAQUE IOHEXOL 350 MG/ML SOLN COMPARISON:  Same-day brain MRI and cervical spine MRI, CT head dated 1 day prior FINDINGS: CT HEAD FINDINGS Brain: There is no acute intracranial hemorrhage, extra-axial fluid collection, or acute infarct. The small infarcts seen in the right aspect of the cervicomedullary  junction on the prior brain MRI is not seen on the current study. Parenchymal volume is normal. The ventricles are normal in size. Gray-white differentiation is preserved. There is no mass lesion.  There is no mass effect or midline shift. Vascular: See below. Skull: Normal. Negative for fracture or focal lesion. Sinuses: There is mucosal thickening in the left maxillary sinus and ethmoid air cells. Orbits: Globes and orbits are unremarkable. Review of the MIP images confirms the above findings CTA NECK FINDINGS Aortic arch: The imaged aortic arch is normal. The origins of the major branch vessels are patent. The subclavian arteries are patent to the level imaged. Right carotid system: Right common, internal, and external carotid arteries are patent, without hemodynamically significant stenosis or occlusion. There is no dissection or aneurysm. Left carotid system: Left common, internal, and external carotid arteries are patent, without hemodynamically significant stenosis or occlusion. There is no dissection or aneurysm. 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 The right vertebral artery is widely patent throughout the neck. Skeleton: There is no acute osseous abnormality or suspicious osseous lesion. Multilevel degenerative changes in the cervical spine are detailed on the prior cervical spine MRI report. There is no visible canal hematoma. Other neck: Soft tissues of the neck are unremarkable. Upper chest: There is a 4 mm nodule in the right upper lobe. Review of the MIP images confirms the above findings CTA HEAD FINDINGS Anterior circulation: The intracranial ICAs are patent. The bilateral MCAs are patent. The bilateral ACAs are patent. The anterior communicating artery is normal. There is no aneurysm or AVM. Posterior circulation: The left V4 segment is not identified. A suspected diminutive left PICA is seen  supplied by the right V4 segment. A small right PICA is also seen. The basilar artery is patent. The bilateral PCAs are patent. Bilateral posterior communicating arteries are identified. There is no aneurysm or AVM. Venous sinuses: Patent. Anatomic variants: As above. Review of the MIP images confirms the above findings IMPRESSION: 1. Unremarkable noncontrast head CT. The small infarct in the right aspect of the cervicomedullary junction seen on the prior MRI is not seen on the current study. 2. 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 3. Otherwise, patent and normal vasculature of the head and neck. 4. 4 mm nodule in the right upper lobe. If the patient is at high risk for malignancy,  noncontrast head CT in 12 months is considered optional. Electronically Signed   By: Valetta Mole M.D.   On: 12/21/2021 10:37   CT Head Wo Contrast  Result Date: 12/20/2021 CLINICAL DATA:  Dizziness, persistent//recurrent, cardiac or vascular cause suspected. Numbness in the right hand, neck and body for 3 days. EXAM: CT HEAD WITHOUT CONTRAST TECHNIQUE: Contiguous axial images were obtained from the base of the skull through the vertex without intravenous contrast. RADIATION DOSE REDUCTION: This exam was performed according to the departmental dose-optimization program which includes automated exposure control, adjustment of the mA and/or kV according to patient size and/or use of iterative reconstruction technique. COMPARISON:  None FINDINGS: Brain: The brain shows a normal appearance without evidence of malformation, atrophy, old or acute small or large vessel infarction, mass lesion, hemorrhage, hydrocephalus or extra-axial collection. No evidence of demyelinating disease. Vascular: No hyperdense vessel. No evidence of atherosclerotic calcification. Skull: Normal.  No traumatic finding.  No focal bone  lesion. Sinuses/Orbits: Sinuses are clear. Orbits appear normal. Mastoids are clear. Other: None significant IMPRESSION: Normal head CT. Electronically Signed   By: Nelson Chimes M.D.   On: 12/20/2021 19:45   MR BRAIN WO CONTRAST  Result Date: 12/21/2021 CLINICAL DATA:  Initial evaluation for neuro deficit, stroke suspected. Numbness in right hand, neck, and right-sided body for 3 days. EXAM: MRI HEAD WITHOUT CONTRAST TECHNIQUE: Multiplanar, multiecho pulse sequences of the brain and surrounding structures were obtained without intravenous contrast. COMPARISON:  CT from 12/20/2021. FINDINGS: Brain: Cerebral volume within normal limits for age. No significant cerebral white matter disease. There is question of a subtle 3 mm focus of restricted diffusion involving the right dorsal aspect of the lower cervicomedullary junction (series 5, image 51). Focus of signal loss seen on corresponding ADC w map (series 6, image 1). This is also faintly visible on coronal DWI sequence (series 7, image 61). Probable subtly increased T2 signal intensity noted at this level as well (series 10, image 1). While this could potentially be artifactual, finding is suspicious for a small ischemic infarct given the patient's symptoms. No associated mass effect or hemorrhage. No other evidence for acute or subacute ischemia. Gray-white matter differentiation otherwise maintained. No encephalomalacia to suggest chronic cortical infarction. No acute or chronic intracranial blood products. No mass lesion, midline shift or mass effect. Ventricles normal size without hydrocephalus. No extra-axial fluid collection. Pituitary gland suprasellar region within normal limits. Midline structures intact and normal. 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 Major intracranial vascular flow voids are otherwise maintained. Skull and upper cervical spine: Craniocervical junction within normal limits. Bone marrow signal intensity  diffusely decreased on T1 weighted sequence, nonspecific, but most commonly related to anemia, smoking or obesity. No scalp soft tissue abnormality. Sinuses/Orbits: Globes and orbital soft tissues within normal limits. Scattered mucosal thickening present throughout the ethmoidal air cells and maxillary sinuses. No mastoid effusion. Other: None. IMPRESSION: 1. Subtle 3 mm focus of restricted diffusion involving the right dorsal aspect of the lower cervicomedullary junction, suspicious for a punctate ischemic infarct given the patient's symptoms. No associated hemorrhage or mass effect. 2. Otherwise normal brain MRI for age. Electronically Signed   By: Jeannine Boga M.D.   On: 12/21/2021 02:29   MR Cervical Spine Wo Contrast  Result Date: 12/21/2021 CLINICAL DATA:  Initial evaluation for numbness in right hand, neck, and right-sided body for 3 days. EXAM: MRI CERVICAL SPINE WITHOUT CONTRAST TECHNIQUE: Multiplanar, multisequence MR imaging of the cervical spine was performed.  No intravenous contrast was administered. COMPARISON:  None Available. FINDINGS: Alignment: Examination degraded by motion artifact and habitus. Straightening with mild reversal of the normal cervical lordosis. No listhesis. Vertebrae: Vertebral body height maintained without acute or chronic fracture. Bone marrow signal intensity diffusely decreased on T1 weighted sequence, nonspecific, but most commonly related to anemia, smoking or obesity. No worrisome osseous lesions. No abnormal marrow edema. Cord: Grossly normal signal and morphology on this somewhat technically limited exam. Posterior Fossa, vertebral arteries, paraspinal tissues: Craniocervical junction within normal limits. Paraspinous soft tissues demonstrate no acute or significant finding. Hypoplastic left vertebral artery not well seen. Well preserved flow void noted within the right vertebral artery. Disc levels: C2-C3: Central disc protrusion indents the ventral thecal  sac. Minimal flattening of the ventral cord without cord signal changes. Mild spinal stenosis. Foramina remain patent. C3-C4: Disc bulge with left greater than right uncovertebral spurring. Superimposed central to right paracentral disc protrusion indents the ventral thecal sac. Mild cord flattening without cord signal changes. Mild to moderate spinal stenosis. Mild to moderate left C4 foraminal narrowing. Right neural foramen is patent. C4-C5: Broad based left paracentral disc protrusion indents and largely effaces the ventral thecal sac. Secondary cord flattening without cord signal changes. Resultant fairly severe spinal stenosis with the thecal sac measuring 6 mm in AP diameter at its most narrow point. Bilateral uncovertebral spurring with resultant moderate left with mild right C5 foraminal narrowing. C5-C6: Left paracentral disc protrusion indents the ventral thecal sac (series 13, image 26). Secondary cord flattening without cord signal changes. Moderate to severe spinal stenosis. Superimposed bilateral uncovertebral spurring with resultant mild left C6 foraminal narrowing. Right neural foramen remains patent. C6-C7: Shallow central disc protrusion indents the ventral thecal sac (series 13, image 32). No significant spinal stenosis. Left-sided uncovertebral spurring with resultant mild left C7 foraminal stenosis. Right neural foramen is patent. C7-T1:  Unremarkable. Visualized upper thoracic spine demonstrates no significant finding. IMPRESSION: 1. Multilevel cervical spondylosis with resultant moderate to severe spinal stenosis at C3-4 through C5-6. 2. Multifactorial degenerative changes with resultant multilevel foraminal narrowing as above. Notable findings include mild to moderate left C4 and C5 foraminal stenosis, with mild left C6 and C7 foraminal narrowing. Electronically Signed   By: Rise Mu M.D.   On: 12/21/2021 02:39   ECHOCARDIOGRAM LIMITED BUBBLE STUDY  Result Date: 12/21/2021     ECHOCARDIOGRAM LIMITED REPORT   Patient Name:   Cyncere Crapps Date of Exam: 12/21/2021 Medical Rec #:  [id number]        Height:       72.0 in Accession #:    [id number]       Weight:       385.0 lb Date of Birth:  11/01/1985        BSA:          2.814 m Patient Age:    36 years         BP:           124/76 mmHg Patient Gender: M                HR:           78 bpm. Exam Location:  [facility] Procedure: Limited Echo, Saline Contrast Bubble Study, Color Doppler and Cardiac            Doppler Indications:     I63.9 Stroke  History:         Patient has no prior history of Echocardiogram examinations.  Signs/Symptoms:Arm numbness.  Sonographer:     Charmayne Sheer Referring Phys:  [id number] [place] Diagnosing Phys: Neoma Laming  Sonographer Comments: Patient is morbidly obese, suboptimal apical window and no subcostal window. IMPRESSIONS  1. Left ventricular ejection fraction, by estimation, is 60 to 65%. The left ventricle has normal function. The left ventricle has no regional wall motion abnormalities.  2. Right ventricular systolic function is normal. The right ventricular size is normal.  3. Left atrial size was mildly dilated.  4. The mitral valve is normal in structure. No evidence of mitral valve regurgitation. No evidence of mitral stenosis.  5. The aortic valve is calcified. Aortic valve regurgitation is trivial. No aortic stenosis is present.  6. The inferior vena cava is normal in size with greater than 50% respiratory variability, suggesting right atrial pressure of 3 mmHg. FINDINGS  Left Ventricle: Left ventricular ejection fraction, by estimation, is 60 to 65%. The left ventricle has normal function. The left ventricle has no regional wall motion abnormalities. The left ventricular internal cavity size was normal in size. There is  no left ventricular hypertrophy. Right Ventricle: The right ventricular size is normal. No increase in right ventricular wall thickness. Right ventricular systolic  function is normal. Left Atrium: Left atrial size was mildly dilated. Right Atrium: Right atrial size was normal in size. Pericardium: There is no evidence of pericardial effusion. Mitral Valve: The mitral valve is normal in structure. No evidence of mitral valve stenosis. MV peak gradient, 2.2 mmHg. The mean mitral valve gradient is 1.0 mmHg. Tricuspid Valve: The tricuspid valve is normal in structure. Tricuspid valve regurgitation is not demonstrated. No evidence of tricuspid stenosis. Aortic Valve: The aortic valve is calcified. Aortic valve regurgitation is trivial. No aortic stenosis is present. Aortic valve mean gradient measures 2.0 mmHg. Aortic valve peak gradient measures 4.2 mmHg. Aortic valve area, by VTI measures 2.55 cm. Pulmonic Valve: The pulmonic valve was normal in structure. Pulmonic valve regurgitation is not visualized. No evidence of pulmonic stenosis. Aorta: The aortic root is normal in size and structure. Venous: The inferior vena cava is normal in size with greater than 50% respiratory variability, suggesting right atrial pressure of 3 mmHg. IAS/Shunts: No atrial level shunt detected by color flow Doppler. Agitated saline contrast was given intravenously to evaluate for intracardiac shunting. LEFT VENTRICLE PLAX 2D LVIDd:         5.04 cm   Diastology LVIDs:         3.52 cm   LV e' medial:    7.62 cm/s LV PW:         1.61 cm   LV E/e' medial:  8.9 LV IVS:        0.97 cm   LV e' lateral:   12.60 cm/s LVOT diam:     2.60 cm   LV E/e' lateral: 5.4 LV SV:         46 LV SV Index:   16 LVOT Area:     5.31 cm  LEFT ATRIUM           Index LA diam:      3.40 cm 1.21 cm/m LA Vol (A4C): 40.6 ml 14.43 ml/m  AORTIC VALVE                    PULMONIC VALVE AV Area (Vmax):    2.96 cm     PV Vmax:       1.03 m/s AV Area (Vmean):   3.05 cm  PV Vmean:      66.000 cm/s AV Area (VTI):     2.55 cm     PV VTI:        0.166 m AV Vmax:           102.00 cm/s  PV Peak grad:  4.2 mmHg AV Vmean:          68.300  cm/s  PV Mean grad:  2.0 mmHg AV VTI:            0.179 m AV Peak Grad:      4.2 mmHg AV Mean Grad:      2.0 mmHg LVOT Vmax:         56.80 cm/s LVOT Vmean:        39.300 cm/s LVOT VTI:          0.086 m LVOT/AV VTI ratio: 0.48  AORTA Ao Root diam: 3.10 cm MITRAL VALVE MV Area (PHT): 3.23 cm    SHUNTS MV Area VTI:   2.23 cm    Systemic VTI:  0.09 m MV Peak grad:  2.2 mmHg    Systemic Diam: 2.60 cm MV Mean grad:  1.0 mmHg MV Vmax:       0.74 m/s MV Vmean:      54.4 cm/s MV Decel Time: 235 msec MV E velocity: 67.70 cm/s MV A velocity: 64.70 cm/s MV E/A ratio:  1.05 Shaukat Khan Electronically signed by Neoma Laming Signature Date/Time: 12/21/2021/3:34:56 PM    Final     Microbiology: Results for orders placed or performed in visit on 06/05/21  Rapid Strep Screen ([facility] ONLY)     Status: Abnormal   Collection Time: 06/05/21 11:26 AM   [place]  Result Value Ref Range Status   Strep Gp A Ag, IA W/Reflex Positive (A) Negative Final    Comment: RESULT GIVEN TO Park Liter MD.  Novel Coronavirus, NAA ([facility])     Status: None   Collection Time: 06/05/21 11:30 AM   Specimen: Saline  Result Value Ref Range Status   SARS-CoV-2, NAA Not Detected Not Detected Final    Comment: This nucleic acid amplification test was developed and its performance characteristics determined by [employer]. Nucleic acid amplification tests include RT-PCR and TMA. This test has not been FDA cleared or approved. This test has been authorized by FDA under an Emergency Use Authorization (EUA). This test is only authorized for the duration of time the declaration that circumstances exist justifying the authorization of the emergency use of in vitro diagnostic tests for detection of SARS-CoV-2 virus and/or diagnosis of COVID-19 infection under section 564(b)(1) of the Act, 21 U.S.C. 510CHE-5(I) (1), unless the authorization is terminated or revoked sooner. When diagnostic testing is negative, the possibility of a  false negative result should be considered in the context of a patient's recent exposures and the presence of clinical signs and symptoms consistent with COVID-19. An individual without symptoms of COVID-19 and who is not shedding SARS-CoV-2 virus wo uld expect to have a negative (not detected) result in this assay.   SARS-COV-2, NAA 2 DAY TAT     Status: None   Collection Time: 06/05/21 11:30 AM  Result Value Ref Range Status   SARS-CoV-2, NAA 2 DAY TAT Performed  Final    Labs: CBC: Recent Labs  Lab 12/20/21 1858 12/21/21 0406  WBC 6.5 5.3  HGB 12.8* 12.7*  HCT 39.1 38.4*  MCV 83.2 83.7  PLT 233 778   Basic Metabolic Panel: Recent  Labs  Lab 12/20/21 1858 12/21/21 0406  NA 138 138  K 3.7 3.8  CL 103 106  CO2 27 27  GLUCOSE 147* 122*  BUN 13 14  CREATININE 0.93 0.79  CALCIUM 8.7* 8.3*   Liver Function Tests: No results for input(s): AST, ALT, ALKPHOS, BILITOT, PROT, ALBUMIN in the last 168 hours. CBG: No results for input(s): GLUCAP in the last 168 hours.  Discharge time spent: greater than 30 minutes.  Signed: Lavaeh Bau, DO [facility] 12/21/2021; 5:10 PM
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
## 2021-12-21 NOTE — Progress Notes (Signed)
Inpatient Diabetes Program Recommendations  AACE/ADA: New Consensus Statement on Inpatient Glycemic Control (2015)  Target Ranges:  Prepandial:   less than 140 mg/dL      Peak postprandial:   less than 180 mg/dL (1-2 hours)      Critically ill patients:  140 - 180 mg/dL   Lab Results  Component Value Date   HGBA1C 7.0 (H) 12/21/2021    Review of Glycemic Control  Diabetes history: No prior hx DM Outpatient Diabetes medications: None Current orders for Inpatient glycemic control: None  Inpatient Diabetes Program Recommendations:   -Glycemic control order set with 0-9 units correction tid, 0-5 units hs Ordered Living Well With Diabetes booklet along with dietician consult. Attempted to call pt. ( DM coordinator working remotely)  Thank you, Jimmy Flores. Jimmy Mclaren, RN, MSN, CDE  Diabetes Coordinator Inpatient Glycemic Control Team Team Pager [phone] (8am-5pm) 12/21/2021 1:50 PM

## 2021-12-21 NOTE — Progress Notes (Signed)
PHARMACIST - PHYSICIAN COMMUNICATION  CONCERNING:  Enoxaparin (Lovenox) for DVT Prophylaxis    RECOMMENDATION: Patient was prescribed enoxaprin 40mg  q24 hours for VTE prophylaxis.   Filed Weights   12/20/21 1856  Weight: (!) 174.6 kg (385 lb)    Body mass index is 52.22 kg/m.  Estimated Creatinine Clearance: 177.3 mL/min (by C-G formula based on SCr of 0.93 mg/dL).   Based on [facility] policy patient is candidate for enoxaparin 0.5mg /kg TBW SQ every 24 hours based on BMI being >30.  DESCRIPTION: Pharmacy has adjusted enoxaparin dose per [facility] policy.  Patient is now receiving enoxaparin 0.5 mg/kg every 24 hours   [facility], PharmD, [facility] 12/21/2021 4:02 AM

## 2021-12-21 NOTE — Progress Notes (Signed)
*  PRELIMINARY RESULTS* Echocardiogram 2D Echocardiogram has been performed.  Jimmy Flores 12/21/2021, 2:42 PM

## 2021-12-21 NOTE — Assessment & Plan Note (Addendum)
-   The patient was admitted to a medical telemetry observation bed. He has been evaluated by neurology He has been started on ASA and Plavix. He will continue these until he follows up with neurology as outpatient. He has been started on a statin for hyperlipidemia. His LDL is 127.  He has been diagnosed with DM II. His Hemoglobin A1c is 7.0. He has been started on metformin, and he will be checking  His glucose BID. Those records will be taken in to PCP on his visit. He has also been started on low dose lisinopril for renal protection.  Echocardiogram was obtained that demonstrated a normal EF of [id number], normal diastolic parameters, and no evidence of shunt or intracardiac thrombus. 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 Otherwise, patent and normal vasculature of the head and neck. MRI Brain demonstrated Subtle 3 mm focus of restricted diffusion involving the right dorsal aspect of the lower cervicomedullary junction, suspicious for a punctate ischemic infarct given the patient's symptoms. No associated hemorrhage or mass effect. Otherwise normal MRI Brain for age. MRI C-spine has demonstrated Multilevel cervical spondylosis with resultant moderate to severe spinal stenosis at C3-4 through C5-6. 2. Multifactorial degenerative changes with resultant multilevel foraminal narrowing as above. Notable findings include mild to moderate left C4 and C5 foraminal stenosis, with mild left C6 and C7 foraminal narrowing. He has been evaluated by PT/OT/SLP. Recommendation is for outpatient OT if his symptoms do not improve before he follows up with PCP.

## 2021-12-21 NOTE — Evaluation (Signed)
Occupational Therapy Evaluation Patient Details Name: Jimmy Flores MRN: [id number] DOB: 07-17-1986 Today's Date: 12/21/2021   History of Present Illness Pt is a 36year old male admitted with acute onset of right sided numbness. 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   Clinical Impression   Chart reviewed, pt greeted in bed with wife present. Pt is alert and oriented x4, good overall awareness of deficits. Pt reports feeling close to baseline however presents with RUE weakness/ sensation changes affecting ADL performance. 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 Pt presents with generalized weakness throughout RUE most notable in R hand with grip strength deficits. Sensation deficits also present throughout RUE. Proprioception/kinesthesia appear WFL however will continue to assess. Pt provided written HEP re: grip strengthening and tasks to improve FMC/dexterity. Pt provided education to follow up with outpatient OT if symptoms do not resolve. Pt is performing ADL close to baseline however requires extra time for tasks requiring R Hand FMC/dexterity. OT will continue to follow acutely.      Recommendations for follow up therapy are one component of a multi-disciplinary discharge planning process, led by the attending physician.  Recommendations may be updated based on patient status, additional functional criteria and insurance authorization.   Follow Up Recommendations  Outpatient OT    Assistance Recommended at Discharge Intermittent Supervision/Assistance  Patient can return home with the following Assistance with cooking/housework    Functional Status Assessment  Patient has had a recent decline in their functional status and demonstrates the ability to make significant improvements in function in a reasonable and predictable amount of time.  Equipment Recommendations  None recommended by OT    Recommendations for Other Services        Precautions / Restrictions Precautions Precautions: None Restrictions Weight Bearing Restrictions: No      Mobility Bed Mobility Overal bed mobility: Independent                  Transfers Overall transfer level: Independent                        Balance Overall balance assessment: No apparent balance deficits (not formally assessed)                                         ADL either performed or assessed with clinical judgement   ADL Overall ADL's : Independent;Modified independent                                       General ADL Comments: Pt close to baseline, MOD I (increased time)  for ADL tasks requiring R hand FMC/dexterity due to weakness; amb community distances with indep     Vision Patient Visual Report: No change from baseline Vision Assessment?: Yes Eye Alignment: Within Functional Limits     Perception     Praxis      Pertinent Vitals/Pain Pain Assessment Pain Assessment: No/denies pain     Hand Dominance Right   Extremity/Trunk Assessment Upper Extremity Assessment Upper Extremity Assessment: RUE deficits/detail RUE Deficits / Details: RUE AROM WFL; strength appears grossly 4/5 throughout shoulder, elbow, wrist; weak grip strength; mildly impaired FMC/dexterity RUE Sensation: decreased light touch (mildly impaired hot/cold sensation) RUE Coordination: decreased fine  motor;decreased gross motor   Lower Extremity Assessment Lower Extremity Assessment: Overall WFL for tasks assessed   Cervical / Trunk Assessment Cervical / Trunk Assessment: Normal   Communication Communication Communication: No difficulties   Cognition Arousal/Alertness: Awake/alert Behavior During Therapy: WFL for tasks assessed/performed Overall Cognitive Status: Within Functional Limits for tasks assessed                                       General Comments       Exercises     Shoulder  Instructions      Home Living Family/patient expects to be discharged to:: Private residence Living Arrangements: Spouse/significant other Available Help at Discharge: Family;Available PRN/intermittently Type of Home: House Home Access: Stairs to enter Entrance Stairs-Number of Steps: 0   Home Layout: Multi-level Alternate Level Stairs-Number of Steps: flight       Bathroom Toilet: Standard     Home Equipment: None          Prior Functioning/Environment Prior Level of Function : Independent/Modified Independent             Mobility Comments: indep no AD ADLs Comments: Indep in all ADL/IADL; pt works in a bank, 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;        OT Problem List: Decreased strength;Impaired UE functional use      OT Treatment/Interventions: Therapeutic exercise;Patient/family education;Self-care/ADL training;DME and/or AE instruction;Therapeutic activities    OT Goals(Current goals can be found in the care plan section) Acute Rehab OT Goals Patient Stated Goal: improve R hand function OT Goal Formulation: With patient Time For Goal Achievement: 01/04/22  OT Frequency: Min 2X/week    Co-evaluation              AM-PAC OT "6 Clicks" Daily Activity     Outcome Measure Help from another person eating meals?: None Help from another person taking care of personal grooming?: None Help from another person toileting, which includes using toliet, bedpan, or urinal?: None Help from another person bathing (including washing, rinsing, drying)?: None Help from another person to put on and taking off regular upper body clothing?: None Help from another person to put on and taking off regular lower body clothing?: None 6 Click Score: 24   End of Session Nurse Communication: Mobility status  Activity Tolerance: Patient tolerated treatment well Patient left: in bed;with call bell/phone within reach;with family/visitor present  OT Visit Diagnosis: Other symptoms and signs  involving the nervous system (R29.898)                Time: 6226-3335 OT Time Calculation (min): 14 min Charges:  OT General Charges $OT Visit: 1 Visit OT Evaluation $OT Eval Low Complexity: 1 Low Oleta Mouse, OTD OTR/L  12/21/21, 3:46 PM

## 2021-12-21 NOTE — Assessment & Plan Note (Signed)
Plan as above.  

## 2021-12-21 NOTE — Progress Notes (Signed)
PT Cancellation Note  Patient Details Name: Jimmy Flores MRN: [id number] DOB: May 27, 1986   Cancelled Treatment:    Reason Eval/Treat Not Completed: PT screened, no needs identified, will sign off. PT order received. Pt able to perform bed mobility, STS and ambulation within room independently; pt reports being at baseline. Pt has no PT needs. Please re-consult if pt status changes.    Patrina Levering PT, DPT

## 2021-12-21 NOTE — Discharge Instructions (Signed)

## 2021-12-21 NOTE — Assessment & Plan Note (Signed)
The patient has been started on a statin.

## 2021-12-25 ENCOUNTER — Inpatient Hospital Stay: Payer: Self-pay | Admitting: Physician Assistant

## 2022-01-10 ENCOUNTER — Encounter: Payer: Self-pay | Admitting: Family Medicine

## 2022-01-10 ENCOUNTER — Ambulatory Visit (INDEPENDENT_AMBULATORY_CARE_PROVIDER_SITE_OTHER): Payer: BC Managed Care – PPO | Admitting: Family Medicine

## 2022-01-10 VITALS — BP 123/82 | HR 74 | Temp 98.1°F | Wt 381.2 lb

## 2022-01-10 DIAGNOSIS — E1165 Type 2 diabetes mellitus with hyperglycemia: Secondary | ICD-10-CM | POA: Diagnosis not present

## 2022-01-10 DIAGNOSIS — E782 Mixed hyperlipidemia: Secondary | ICD-10-CM | POA: Diagnosis not present

## 2022-01-10 DIAGNOSIS — I6930 Unspecified sequelae of cerebral infarction: Secondary | ICD-10-CM

## 2022-01-10 DIAGNOSIS — M4712 Other spondylosis with myelopathy, cervical region: Secondary | ICD-10-CM

## 2022-01-10 DIAGNOSIS — Z23 Encounter for immunization: Secondary | ICD-10-CM | POA: Diagnosis not present

## 2022-01-10 DIAGNOSIS — M47812 Spondylosis without myelopathy or radiculopathy, cervical region: Secondary | ICD-10-CM | POA: Insufficient documentation

## 2022-01-10 DIAGNOSIS — R911 Solitary pulmonary nodule: Secondary | ICD-10-CM

## 2022-01-10 DIAGNOSIS — M1711 Unilateral primary osteoarthritis, right knee: Secondary | ICD-10-CM

## 2022-01-10 DIAGNOSIS — G4733 Obstructive sleep apnea (adult) (pediatric): Secondary | ICD-10-CM

## 2022-01-10 LAB — MICROALBUMIN, URINE WAIVED
Creatinine, Urine Waived: 200 mg/dL (ref 10–300)
Microalb, Ur Waived: 30 mg/L — ABNORMAL HIGH (ref 0–19)
Microalb/Creat Ratio: <30 mg/g (ref <30)

## 2022-01-10 MED ORDER — ATORVASTATIN CALCIUM 20 MG PO TABS: 20.0000 mg | ORAL_TABLET | Freq: Every day | ORAL | 1 refills | Status: DC

## 2022-01-10 MED ORDER — METFORMIN HCL 1000 MG PO TABS
1000.0000 mg | ORAL_TABLET | Freq: Two times a day (BID) | ORAL | 1 refills | Status: DC
Start: 2022-01-10 — End: 2022-06-17

## 2022-01-10 MED ORDER — LISINOPRIL 5 MG PO TABS: 5.0000 mg | ORAL_TABLET | Freq: Every day | ORAL | 1 refills | Status: DC

## 2022-01-10 NOTE — Assessment & Plan Note (Signed)
Needs CPAP. Auto-titrating CPAP ordered today.

## 2022-01-10 NOTE — Assessment & Plan Note (Signed)
Would like to see ortho. Will let us know who he wants to see and we'll refer.

## 2022-01-10 NOTE — Progress Notes (Signed)
BP 123/82   Pulse 74   Temp 98.1 F (36.7 C)   Wt (!) 381 lb 3.2 oz (172.9 kg)   SpO2 98%   BMI 51.70 kg/m    Subjective:    Patient ID: Jimmy Flores, male    DOB: February 01, 1986, 36 y.o.   MRN: [id number]  HPI: [name] is a 36 y.o. male  Chief Complaint  Patient presents with   Hospitalization Follow-up    Patient states he was having tingling and numbness on his upper right side of the body so he went to ED. Patient states he is still having some tingling from his right shoulder to his hand. Patient does not have appointment with neurology yet, would like to go somewhere in [place] [facility] for neurology.    Diabetes   Sleep Apnea    Patient states he had sleep study done and got a call from a company in regards to a CPAP  but patient wasn't aware of his sleep study results so he disregarded it.    Transition of Care Hospital Follow up.   Hospital/Facility: [facility] D/C Physician: Fran Lowes, DO D/C Date: 12/21/21  Records Requested: 01/10/22 Records Received: 01/10/22 Records Reviewed: 01/10/22  Diagnoses on Discharge:    Acute stroke of medulla oblongata (HCC) Active Problems:   Right sided numbness   Morbid obesity (HCC)   DM II (diabetes mellitus, type II), controlled (HCC)   Hyperlipidemia  Date of interactive Contact within 48 hours of discharge: Not done Contact was through:  None  Date of 7 day or 14 day face-to-face visit:  01/10/22  NOT within 14 days  Outpatient Encounter Medications as of 01/10/2022  Medication Sig   aspirin EC 81 MG tablet Take 1 tablet (81 mg total) by mouth daily. Swallow whole.   blood glucose meter kit and supplies KIT Dispense based on patient and insurance preference. Use up to four times daily as directed.   clopidogrel (PLAVIX) 75 MG tablet Take 1 tablet (75 mg total) by mouth daily.   [DISCONTINUED] atorvastatin (LIPITOR) 20 MG tablet Take 1 tablet (20 mg total) by mouth daily.   [DISCONTINUED] lisinopril (ZESTRIL) 5 MG tablet  Take 1 tablet (5 mg total) by mouth daily.   [DISCONTINUED] metFORMIN (GLUCOPHAGE) 1000 MG tablet Take 1 tablet (1,000 mg total) by mouth 2 (two) times daily with a meal.   atorvastatin (LIPITOR) 20 MG tablet Take 1 tablet (20 mg total) by mouth daily.   lisinopril (ZESTRIL) 5 MG tablet Take 1 tablet (5 mg total) by mouth daily.   metFORMIN (GLUCOPHAGE) 1000 MG tablet Take 1 tablet (1,000 mg total) by mouth 2 (two) times daily with a meal.   [DISCONTINUED] Multiple Vitamin (MULTIVITAMIN PO) Take 2 tablets by mouth daily. GUMMIES (Patient not taking: Reported on 01/10/2022)   No facility-administered encounter medications on file as of 01/10/2022.  Per Hospitalist: "* Acute stroke of medulla oblongata (HCC) - The patient was admitted to a medical telemetry observation bed. He has been evaluated by neurology He has been started on ASA and Plavix. He will continue these until he follows up with neurology as outpatient. He has been started on a statin for hyperlipidemia. His LDL is 127.  He has been diagnosed with DM II. His Hemoglobin A1c is 7.0. He has been started on metformin, and he will be checking  His glucose BID. Those records will be taken in to PCP on his visit. He has also been started on low dose lisinopril for  renal protection.  Echocardiogram was obtained that demonstrated a normal EF of 15-94%, normal diastolic parameters, and no evidence of shunt or intracardiac thrombus. 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 Otherwise, patent and normal vasculature of the head and neck. MRI Brain demonstrated Subtle 3 mm focus of restricted diffusion involving the right dorsal aspect of the lower cervicomedullary junction, suspicious for a punctate ischemic infarct given the patient's symptoms. No associated hemorrhage or mass effect.  Otherwise normal MRI Brain for age. MRI C-spine has demonstrated Multilevel cervical spondylosis with resultant moderate to severe spinal stenosis at C3-4 through C5-6. 2. Multifactorial degenerative changes with resultant multilevel foraminal narrowing as above. Notable findings include mild to moderate left C4 and C5 foraminal stenosis, with mild left C6 and C7 foraminal narrowing. He has been evaluated by PT/OT/SLP. Recommendation is for outpatient OT if his symptoms do not improve before he follows up with PCP.   Right sided numbness - Plan as above.   Hyperlipidemia The patient has been started on a statin.   DM II (diabetes mellitus, type II), controlled ([place]) New diagnosis. HbA1c 7.0. Patient has had diabetic teaching. He has been taught how to use glucometer. He will be discharged on Metformin and has been asked to check his glucoses bid and record. This record is to be taken in with the patient to his follow up with PCP. He has also been started on lisinopril 5 mg daily for renal protection."  Diagnostic Tests Reviewed: CLINICAL DATA:  Dizziness, persistent//recurrent, cardiac or vascular cause suspected. Numbness in the right hand, neck and body for 3 days.   EXAM: CT HEAD WITHOUT CONTRAST   TECHNIQUE: Contiguous axial images were obtained from the base of the skull through the vertex without intravenous contrast.   RADIATION DOSE REDUCTION: This exam was performed according to the departmental dose-optimization program which includes automated exposure control, adjustment of the mA and/or kV according to patient size and/or use of iterative reconstruction technique.   COMPARISON:  None   FINDINGS: Brain: The brain shows a normal appearance without evidence of malformation, atrophy, old or acute small or large vessel infarction, mass lesion, hemorrhage, hydrocephalus or extra-axial collection. No evidence of demyelinating disease.   Vascular: No hyperdense vessel.  No evidence of atherosclerotic calcification.   Skull: Normal.  No traumatic finding.  No focal bone lesion.   Sinuses/Orbits: Sinuses are clear. Orbits appear normal. Mastoids are clear.   Other: None significant   IMPRESSION: Normal head CT.  CLINICAL DATA:  Initial evaluation for neuro deficit, stroke suspected. Numbness in right hand, neck, and right-sided body for 3 days.   EXAM: MRI HEAD WITHOUT CONTRAST   TECHNIQUE: Multiplanar, multiecho pulse sequences of the brain and surrounding structures were obtained without intravenous contrast.   COMPARISON:  CT from 12/20/2021.   FINDINGS: Brain: Cerebral volume within normal limits for age. No significant cerebral white matter disease.   There is question of a subtle 3 mm focus of restricted diffusion involving the right dorsal aspect of the lower cervicomedullary junction (series 5, image 51). Focus of signal loss seen on corresponding ADC w map (series 6, image 1). This is also faintly visible on coronal DWI sequence (series 7, image 61). Probable subtly increased T2 signal intensity noted at this level as well (series 10, image 1). While this could potentially be artifactual, finding is suspicious for a small  ischemic infarct given the patient's symptoms. No associated mass effect or hemorrhage.   No other evidence for acute or subacute ischemia. Gray-white matter differentiation otherwise maintained. No encephalomalacia to suggest chronic cortical infarction. No acute or chronic intracranial blood products.   No mass lesion, midline shift or mass effect. Ventricles normal size without hydrocephalus. No extra-axial fluid collection. Pituitary gland suprasellar region within normal limits. Midline structures intact and normal.   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 Major intracranial vascular flow voids are otherwise maintained.   Skull and upper cervical spine: Craniocervical junction  within normal limits. Bone marrow signal intensity diffusely decreased on T1 weighted sequence, nonspecific, but most commonly related to anemia, smoking or obesity. No scalp soft tissue abnormality.   Sinuses/Orbits: Globes and orbital soft tissues within normal limits. Scattered mucosal thickening present throughout the ethmoidal air cells and maxillary sinuses. No mastoid effusion.   Other: None.   IMPRESSION: 1. Subtle 3 mm focus of restricted diffusion involving the right dorsal aspect of the lower cervicomedullary junction, suspicious for a punctate ischemic infarct given the patient's symptoms. No associated hemorrhage or mass effect. 2. Otherwise normal brain MRI for age.  CLINICAL DATA:  Initial evaluation for numbness in right hand, neck, and right-sided body for 3 days.   EXAM: MRI CERVICAL SPINE WITHOUT CONTRAST   TECHNIQUE: Multiplanar, multisequence MR imaging of the cervical spine was performed. No intravenous contrast was administered.   COMPARISON:  None Available.   FINDINGS: Alignment: Examination degraded by motion artifact and habitus.   Straightening with mild reversal of the normal cervical lordosis. No listhesis.   Vertebrae: Vertebral body height maintained without acute or chronic fracture. Bone marrow signal intensity diffusely decreased on T1 weighted sequence, nonspecific, but most commonly related to anemia, smoking or obesity. No worrisome osseous lesions. No abnormal marrow edema.   Cord: Grossly normal signal and morphology on this somewhat technically limited exam.   Posterior Fossa, vertebral arteries, paraspinal tissues: Craniocervical junction within normal limits. Paraspinous soft tissues demonstrate no acute or significant finding. Hypoplastic left vertebral artery not well seen. Well preserved flow void noted within the right vertebral artery.   Disc levels:   C2-C3: Central disc protrusion indents the ventral thecal  sac. Minimal flattening of the ventral cord without cord signal changes. Mild spinal stenosis. Foramina remain patent.   C3-C4: Disc bulge with left greater than right uncovertebral spurring. Superimposed central to right paracentral disc protrusion indents the ventral thecal sac. Mild cord flattening without cord signal changes. Mild to moderate spinal stenosis. Mild to moderate left C4 foraminal narrowing. Right neural foramen is patent.   C4-C5: Broad based left paracentral disc protrusion indents and largely effaces the ventral thecal sac. Secondary cord flattening without cord signal changes. Resultant fairly severe spinal stenosis with the thecal sac measuring 6 mm in AP diameter at its most narrow point. Bilateral uncovertebral spurring with resultant moderate left with mild right C5 foraminal narrowing.   C5-C6: Left paracentral disc protrusion indents the ventral thecal sac (series 13, image 26). Secondary cord flattening without cord signal changes. Moderate to severe spinal stenosis. Superimposed bilateral uncovertebral spurring with resultant mild left C6 foraminal narrowing. Right neural foramen remains patent.   C6-C7: Shallow central disc protrusion indents the ventral thecal sac (series 13, image 32). No significant spinal stenosis. Left-sided uncovertebral spurring with resultant mild left C7 foraminal stenosis. Right neural foramen is patent.   C7-T1:  Unremarkable.   Visualized upper thoracic spine demonstrates no significant  finding.   IMPRESSION: 1. Multilevel cervical spondylosis with resultant moderate to severe spinal stenosis at C3-4 through C5-6. 2. Multifactorial degenerative changes with resultant multilevel foraminal narrowing as above. Notable findings include mild to moderate left C4 and C5 foraminal stenosis, with mild left C6 and C7 foraminal narrowing.  CLINICAL DATA:  Numbness in right hand, neck, and right-sided body for 3 days.    EXAM: CT ANGIOGRAPHY HEAD AND NECK   TECHNIQUE: Multidetector CT imaging of the head and neck was performed using the standard protocol during bolus administration of intravenous contrast. Multiplanar CT image reconstructions and MIPs were obtained to evaluate the vascular anatomy. Carotid stenosis measurements (when applicable) are obtained utilizing NASCET criteria, using the distal internal carotid diameter as the denominator.   RADIATION DOSE REDUCTION: This exam was performed according to the departmental dose-optimization program which includes automated exposure control, adjustment of the mA and/or kV according to patient size and/or use of iterative reconstruction technique.   CONTRAST:  9mL OMNIPAQUE IOHEXOL 350 MG/ML SOLN   COMPARISON:  Same-day brain MRI and cervical spine MRI, CT head dated 1 day prior   FINDINGS: CT HEAD FINDINGS   Brain: There is no acute intracranial hemorrhage, extra-axial fluid collection, or acute infarct. The small infarcts seen in the right aspect of the cervicomedullary junction on the prior brain MRI is not seen on the current study.   Parenchymal volume is normal. The ventricles are normal in size. Gray-white differentiation is preserved.   There is no mass lesion.  There is no mass effect or midline shift.   Vascular: See below.   Skull: Normal. Negative for fracture or focal lesion.   Sinuses: There is mucosal thickening in the left maxillary sinus and ethmoid air cells.   Orbits: Globes and orbits are unremarkable.   Review of the MIP images confirms the above findings   CTA NECK FINDINGS   Aortic arch: The imaged aortic arch is normal. The origins of the major branch vessels are patent. The subclavian arteries are patent to the level imaged.   Right carotid system: Right common, internal, and external carotid arteries are patent, without hemodynamically significant stenosis or occlusion. There is no dissection or  aneurysm.   Left carotid system: Left common, internal, and external carotid arteries are patent, without hemodynamically significant stenosis or occlusion. There is no dissection or aneurysm.   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 The right vertebral artery is widely patent throughout the neck.   Skeleton: There is no acute osseous abnormality or suspicious osseous lesion. Multilevel degenerative changes in the cervical spine are detailed on the prior cervical spine MRI report. There is no visible canal hematoma.   Other neck: Soft tissues of the neck are unremarkable.   Upper chest: There is a 4 mm nodule in the right upper lobe.   Review of the MIP images confirms the above findings   CTA HEAD FINDINGS   Anterior circulation: The intracranial ICAs are patent.   The bilateral MCAs are patent.   The bilateral ACAs are patent. The anterior communicating artery is normal.   There is no aneurysm or AVM.   Posterior circulation: The left V4 segment is not identified. A suspected diminutive left PICA is seen supplied by the right V4 segment. A small right PICA is also seen. The basilar artery is patent.   The bilateral PCAs are patent. Bilateral posterior communicating arteries are identified.  There is no aneurysm or AVM.   Venous sinuses: Patent.   Anatomic variants: As above.   Review of the MIP images confirms the above findings   IMPRESSION: 1. Unremarkable noncontrast head CT. The small infarct in the right aspect of the cervicomedullary junction seen on the prior MRI is not seen on the current study. 2. 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 3. Otherwise, patent and normal vasculature of the head and neck. 4. 4 mm nodule in the  right upper lobe. If the patient is at high risk for malignancy, noncontrast head CT in 12 months is considered optional.  Disposition: Home  Consults: Neurology and Diabetic educator   Discharge Instructions:  Discharge to home. Check glucose twice a day. Once in the morning before breakfast and once in the evening before dinner. Write down these values and take in to visit with PCP. Follow up with PCP in 1-2 weeks.  If patient is not having improvement in paresthesia symptoms at time of PCP visit, he should have referral to outpatient OT.  Follow up with neurology in 3 weeks. Call for appointment. Walk for a minimum of 20 minutes a day. Increase duration of walk as tolerated.  Disease/illness Education: Discussed today  Home Health/Community Services Discussions/Referrals: N/A  Establishment or re-establishment of referral orders for community resources: N/A  Discussion with other health care providers: N/A  Assessment and Support of treatment regimen adherence: Good  Appointments Coordinated with: Patient   Education for self-management, independent living, and ADLs: Discussed today  Since getting out of the hospital, Jaeveon has been feeling better. He notes that he is still havin a little bit on numbness and tingling. No more weakness. Has been doing his exercises well. He feels like he continues to get better and notes that his sugars have also been getting better. No other concerns today.  HYPERTENSION / HYPERLIPIDEMIA Satisfied with current treatment? yes Duration of hypertension:  newly diagnosed BP monitoring frequency: not checking BP medication side effects: no Past BP meds: lisinopril Duration of hyperlipidemia:  newly diagnosed Cholesterol medication side effects: no Cholesterol supplements: none Past cholesterol medications: atorvastatin Medication compliance: excellent compliance Aspirin: yes Recent stressors: no Recurrent headaches: no Visual changes:  no Palpitations: no Dyspnea: no Chest pain: no Lower extremity edema: no Dizzy/lightheaded: no  SLEEP APNEA Sleep apnea status: uncontrolled Duration: chronic Satisfied with current treatment?:  no CPAP use:  no- hasn't been able to get it Sleep quality with CPAP use: N/A Treament compliance:doesn't have a CPAP Last sleep study: 07/2021 Treatments attempted: none Wakes feeling refreshed:  no Daytime hypersomnolence:  yes Fatigue:  yes Insomnia:  no Good sleep hygiene:  yes Difficulty falling asleep:  no Difficulty staying asleep:  no Snoring bothers bed partner:  yes Observed apnea by bed partner: yes Obesity:  yes Hypertension: no  Pulmonary hypertension:  no Coronary artery disease:  no  DIABETES Hypoglycemic episodes:no Polydipsia/polyuria: no Visual disturbance: no Chest pain: no Paresthesias: yes Glucose Monitoring: yes  Accucheck frequency: BID  Fasting glucose:  Before meals: Taking Insulin?: no Blood Pressure Monitoring: not checking Retinal Examination: Not up to Date Foot Exam: Up to Date Diabetic Education: Completed Pneumovax:  Given today Influenza: Not up to Date Aspirin: yes   Relevant past medical, surgical, family and social history reviewed and updated as indicated. Interim medical history since our last visit reviewed. Allergies and medications reviewed and updated.  Review of Systems  Constitutional: Negative.   HENT: Negative.    Respiratory: Negative.    Cardiovascular: Negative.   Musculoskeletal: Negative.   Neurological:  Positive for weakness and numbness. Negative for dizziness, tremors, seizures, syncope, facial asymmetry, speech difficulty, light-headedness and headaches.  Psychiatric/Behavioral: Negative.      Per HPI unless specifically indicated above     Objective:    BP 123/82   Pulse 74   Temp 98.1 F (36.7 C)   Wt (!) 381 lb 3.2 oz (172.9 kg)   SpO2 98%   BMI 51.70 kg/m   Wt Readings from Last 3 Encounters:   01/10/22 (!) 381 lb 3.2 oz (172.9 kg)  12/20/21 (!) 385 lb (174.6 kg)  02/13/21 (!) 386 lb 3.2 oz (175.2 kg)    Physical Exam Vitals and nursing note reviewed.  Constitutional:      General: He is not in acute distress.    Appearance: Normal appearance. He is obese. He is not ill-appearing, toxic-appearing or diaphoretic.  HENT:     Head: Normocephalic and atraumatic.     Right Ear: External ear normal.     Left Ear: External ear normal.     Nose: Nose normal.     Mouth/Throat:     Mouth: Mucous membranes are moist.     Pharynx: Oropharynx is clear.  Eyes:     General: No scleral icterus.       Right eye: No discharge.        Left eye: No discharge.     Extraocular Movements: Extraocular movements intact.     Conjunctiva/sclera: Conjunctivae normal.     Pupils: Pupils are equal, round, and reactive to light.  Cardiovascular:     Rate and Rhythm: Normal rate and regular rhythm.     Pulses: Normal pulses.     Heart sounds: Normal heart sounds. No murmur heard.    No friction rub. No gallop.  Pulmonary:     Effort: Pulmonary effort is normal. No respiratory distress.     Breath sounds: Normal breath sounds. No stridor. No wheezing, rhonchi or rales.  Chest:     Chest wall: No tenderness.  Musculoskeletal:        General: Normal range of motion.     Cervical back: Normal range of motion and neck supple.  Skin:    General: Skin is warm and dry.     Capillary Refill: Capillary refill takes less than 2 seconds.     Coloration: Skin is not jaundiced or pale.     Findings: No bruising, erythema, lesion or rash.  Neurological:     General: No focal deficit present.     Mental Status: He is alert and oriented to person, place, and time. Mental status is at baseline.  Psychiatric:        Mood and Affect: Mood normal.        Behavior: Behavior normal.        Thought Content: Thought content normal.        Judgment: Judgment normal.     Results for orders placed or performed  during the hospital encounter of 12/20/21  Basic metabolic panel  Result Value Ref Range   Sodium 138 135 - 145 mmol/L   Potassium 3.7 3.5 - 5.1 mmol/L   Chloride 103 98 - 111 mmol/L   CO2 27 22 - 32 mmol/L   Glucose, Bld 147 (H) 70 - 99 mg/dL   BUN 13 6 - 20 mg/dL  Creatinine, Ser 0.93 0.61 - 1.24 mg/dL   Calcium 8.7 (L) 8.9 - 10.3 mg/dL   GFR, Estimated >60 >60 mL/min   Anion gap 8 5 - 15  CBC  Result Value Ref Range   WBC 6.5 4.0 - 10.5 K/uL   RBC 4.70 4.22 - 5.81 MIL/uL   Hemoglobin 12.8 (L) 13.0 - 17.0 g/dL   HCT 39.1 39.0 - 52.0 %   MCV 83.2 80.0 - 100.0 fL   MCH 27.2 26.0 - 34.0 pg   MCHC 32.7 30.0 - 36.0 g/dL   RDW 13.2 11.5 - 15.5 %   Platelets 233 150 - 400 K/uL   nRBC 0.0 0.0 - 0.2 %  Lipid panel  Result Value Ref Range   Cholesterol 169 0 - 200 mg/dL   Triglycerides 58 <150 mg/dL   HDL 31 (L) >40 mg/dL   Total CHOL/HDL Ratio 5.5 RATIO   VLDL 12 0 - 40 mg/dL   LDL Cholesterol 126 (H) 0 - 99 mg/dL  Hemoglobin A1c  Result Value Ref Range   Hgb A1c MFr Bld 7.0 (H) 4.8 - 5.6 %   Mean Plasma Glucose 154.2 mg/dL  HIV Antibody (routine testing w rflx)  Result Value Ref Range   HIV Screen 4th Generation wRfx Non Reactive Non Reactive  Basic metabolic panel  Result Value Ref Range   Sodium 138 135 - 145 mmol/L   Potassium 3.8 3.5 - 5.1 mmol/L   Chloride 106 98 - 111 mmol/L   CO2 27 22 - 32 mmol/L   Glucose, Bld 122 (H) 70 - 99 mg/dL   BUN 14 6 - 20 mg/dL   Creatinine, Ser 0.79 0.61 - 1.24 mg/dL   Calcium 8.3 (L) 8.9 - 10.3 mg/dL   GFR, Estimated >60 >60 mL/min   Anion gap 5 5 - 15  CBC  Result Value Ref Range   WBC 5.3 4.0 - 10.5 K/uL   RBC 4.59 4.22 - 5.81 MIL/uL   Hemoglobin 12.7 (L) 13.0 - 17.0 g/dL   HCT 38.4 (L) 39.0 - 52.0 %   MCV 83.7 80.0 - 100.0 fL   MCH 27.7 26.0 - 34.0 pg   MCHC 33.1 30.0 - 36.0 g/dL   RDW 13.2 11.5 - 15.5 %   Platelets 200 150 - 400 K/uL   nRBC 0.0 0.0 - 0.2 %  ECHOCARDIOGRAM LIMITED BUBBLE STUDY  Result Value Ref Range    Ao pk vel 1.02 m/s   AV Area VTI 2.55 cm2   AR max vel 2.96 cm2   AV Mean grad 2.0 mmHg   AV Peak grad 4.2 mmHg   S' Lateral 3.52 cm   AV Area mean vel 3.05 cm2   Area-P 1/2 3.23 cm2   MV VTI 2.23 cm2  Troponin I (High Sensitivity)  Result Value Ref Range   Troponin I (High Sensitivity) 2 <18 ng/L  Troponin I (High Sensitivity)  Result Value Ref Range   Troponin I (High Sensitivity) 3 <18 ng/L      Assessment & Plan:   Problem List Items Addressed This Visit       Respiratory   Lung nodule seen on imaging study    Follow up imaging in 1 year.       OSA (obstructive sleep apnea)    Needs CPAP. Auto-titrating CPAP ordered today.         Endocrine   DM II (diabetes mellitus, type II), controlled ([place])    A1c 7.0 2 weeks ago.  Started on $RemoveBe'1000mg'[id number]$  metformin BID. Tolerating it well. Will recheck A1c on 2.5 months. Call with any concerns.       Relevant Medications   metFORMIN (GLUCOPHAGE) 1000 MG tablet   lisinopril (ZESTRIL) 5 MG tablet   atorvastatin (LIPITOR) 20 MG tablet   Other Relevant Orders   Microalbumin, Urine Waived   CBC with Differential/Platelet   Basic metabolic panel     Musculoskeletal and Integument   DJD (degenerative joint disease) of cervical spine    Found incidentally on cervical MRI. Continues to improve with exercises. Will hold on referral to ortho now.       Osteoarthritis of right knee    Would like to see ortho. Will let us know who he wants to see and we'll refer.         Other   History of stroke with residual deficit - Primary    Still having some numbness in his hand, but continues to get progressively better. Will get him into neurology. Referral generated today. Will keep BP, cholesterol and A1c under good control. Call with any concerns.       Relevant Orders   Ambulatory referral to Neurology   Hyperlipidemia    Just started on atorvastatin 2 weeks ago. Tolerating it well. Early for recheck on labs. Will check again in  about a month.       Relevant Medications   lisinopril (ZESTRIL) 5 MG tablet   atorvastatin (LIPITOR) 20 MG tablet     Follow up plan: Return 2.5 months follow up.   >40 minutes spent with patient today.

## 2022-01-10 NOTE — Assessment & Plan Note (Signed)
Follow up imaging in 1 year.

## 2022-01-10 NOTE — Assessment & Plan Note (Signed)
A1c 7.0 2 weeks ago. Started on 1000mg  metformin BID. Tolerating it well. Will recheck A1c on 2.5 months. Call with any concerns.

## 2022-01-10 NOTE — Assessment & Plan Note (Addendum)
Found incidentally on cervical MRI. Continues to improve with exercises. Will hold on referral to ortho now.

## 2022-01-10 NOTE — Assessment & Plan Note (Signed)
Still having some numbness in his hand, but continues to get progressively better. Will get him into neurology. Referral generated today. Will keep BP, cholesterol and A1c under good control. Call with any concerns.

## 2022-01-11 LAB — CBC WITH DIFFERENTIAL/PLATELET
Basophils Absolute: 0 10*3/uL (ref 0.0–0.2)
Basos: 0 %
EOS (ABSOLUTE): 0.2 10*3/uL (ref 0.0–0.4)
Eos: 3 %
Hematocrit: 40.1 % (ref 37.5–51.0)
Hemoglobin: 13.4 g/dL (ref 13.0–17.7)
Immature Grans (Abs): 0 10*3/uL (ref 0.0–0.1)
Immature Granulocytes: 0 %
Lymphocytes Absolute: 1.4 10*3/uL (ref 0.7–3.1)
Lymphs: 25 %
MCH: 27.5 pg (ref 26.6–33.0)
MCHC: 33.4 g/dL (ref 31.5–35.7)
MCV: 82 fL (ref 79–97)
Monocytes Absolute: 0.3 10*3/uL (ref 0.1–0.9)
Monocytes: 5 %
Neutrophils Absolute: 3.7 10*3/uL (ref 1.4–7.0)
Neutrophils: 67 %
Platelets: 217 10*3/uL (ref 150–450)
RBC: 4.88 x10E6/uL (ref 4.14–5.80)
RDW: 13.1 % (ref 11.6–15.4)
WBC: 5.6 10*3/uL (ref 3.4–10.8)

## 2022-01-11 LAB — BASIC METABOLIC PANEL
BUN/Creatinine Ratio: 16 (ref 9–20)
BUN: 12 mg/dL (ref 6–20)
CO2: 22 mmol/L (ref 20–29)
Calcium: 9.1 mg/dL (ref 8.7–10.2)
Chloride: 103 mmol/L (ref 96–106)
Creatinine, Ser: 0.74 mg/dL — ABNORMAL LOW (ref 0.76–1.27)
Glucose: 84 mg/dL (ref 70–99)
Potassium: 4.5 mmol/L (ref 3.5–5.2)
Sodium: 143 mmol/L (ref 134–144)
eGFR: 119 mL/min/{1.73_m2} (ref >59)

## 2022-01-14 ENCOUNTER — Telehealth: Payer: Self-pay

## 2022-02-14 ENCOUNTER — Encounter: Payer: Managed Care, Other (non HMO) | Admitting: Family Medicine

## 2022-02-16 ENCOUNTER — Encounter: Payer: Self-pay | Admitting: Family Medicine

## 2022-02-21 ENCOUNTER — Ambulatory Visit: Payer: BC Managed Care – PPO | Admitting: Family Medicine

## 2022-02-25 NOTE — Telephone Encounter (Signed)
Jimmy Flores can we check on this one?

## 2022-02-26 ENCOUNTER — Ambulatory Visit: Payer: BC Managed Care – PPO | Admitting: Family Medicine

## 2022-03-13 ENCOUNTER — Ambulatory Visit (INDEPENDENT_AMBULATORY_CARE_PROVIDER_SITE_OTHER): Payer: BC Managed Care – PPO | Admitting: Family Medicine

## 2022-03-13 ENCOUNTER — Encounter: Payer: Self-pay | Admitting: Family Medicine

## 2022-03-13 VITALS — BP 115/64 | HR 72 | Temp 97.9°F | Wt 359.2 lb

## 2022-03-13 DIAGNOSIS — G4733 Obstructive sleep apnea (adult) (pediatric): Secondary | ICD-10-CM

## 2022-03-13 DIAGNOSIS — E1165 Type 2 diabetes mellitus with hyperglycemia: Secondary | ICD-10-CM

## 2022-03-13 DIAGNOSIS — E782 Mixed hyperlipidemia: Secondary | ICD-10-CM | POA: Diagnosis not present

## 2022-03-13 LAB — BAYER DCA HB A1C WAIVED: HB A1C (BAYER DCA - WAIVED): 6 % — ABNORMAL HIGH (ref 4.8–5.6)

## 2022-03-13 MED ORDER — ASPIRIN 81 MG PO TBEC: 81.0000 mg | DELAYED_RELEASE_TABLET | Freq: Every day | ORAL | 4 refills | Status: DC

## 2022-03-13 MED ORDER — LISINOPRIL 5 MG PO TABS: 5.0000 mg | ORAL_TABLET | Freq: Every day | ORAL | 1 refills | Status: DC

## 2022-03-13 NOTE — Assessment & Plan Note (Signed)
Tolerating CPAP well. Feeling well. Continue CPAP.

## 2022-03-13 NOTE — Progress Notes (Signed)
BP 115/64   Pulse 72   Temp 97.9 F (36.6 C)   Wt (!) 359 lb 3.2 oz (162.9 kg)   SpO2 97%   BMI 48.72 kg/m    Subjective:    Patient ID: Jimmy Flores, male    DOB: 12-May-1986, 36 y.o.   MRN: [id number]  HPI: Jimmy Flores is a 36 y.o. male  Chief Complaint  Patient presents with   Diabetes    Patient has not had eye exam this year    Hyperlipidemia   history of stroke    Patient states he has appointment with neurology tomorrow    Hypertension    Patient states he has not been taking lisinopril, did not pick up at pharmacy    DIABETES Hypoglycemic episodes:no Polydipsia/polyuria: no Visual disturbance: no Chest pain: no Paresthesias: no Glucose Monitoring: no  Accucheck frequency: Not Checking  Fasting glucose:  Post prandial:  Evening:  Before meals: Taking Insulin?: no  Long acting insulin:  Short acting insulin: Blood Pressure Monitoring: not checking Retinal Examination: Not up to Date Foot Exam: Up to Date Diabetic Education: Completed Pneumovax: Up to Date Influenza: Not up to Date Aspirin: yes  HYPERTENSION / HYPERLIPIDEMIA Satisfied with current treatment? yes Duration of hypertension: chronic BP monitoring frequency: not checking BP medication side effects: no Past BP meds: lisinopril Duration of hyperlipidemia: chronic Cholesterol medication side effects: no Cholesterol supplements: none Past cholesterol medications: atorvastatin Medication compliance: excellent compliance Aspirin: yes Recent stressors: no Recurrent headaches: no Visual changes: no Palpitations: no Dyspnea: no Chest pain: no Lower extremity edema: no Dizzy/lightheaded: no  SLEEP APNEA Sleep apnea status: better Duration:  couple of weeks Satisfied with current treatment?:  yes CPAP use:  yes Sleep quality with CPAP use:  good Treament compliance:excellent compliance Last sleep study:  Treatments attempted: CPAP Wakes feeling refreshed:  yes Daytime  hypersomnolence:  no Fatigue:  no Insomnia:  no Good sleep hygiene:  yes Difficulty falling asleep:  no Difficulty staying asleep:  no Snoring bothers bed partner:  no Observed apnea by bed partner: no Obesity:  yes Hypertension: no  Pulmonary hypertension:  no Coronary artery disease:  no   Relevant past medical, surgical, family and social history reviewed and updated as indicated. Interim medical history since our last visit reviewed. Allergies and medications reviewed and updated.  Review of Systems  Constitutional: Negative.   Respiratory: Negative.    Cardiovascular: Negative.   Gastrointestinal: Negative.   Musculoskeletal: Negative.   Neurological: Negative.   Psychiatric/Behavioral: Negative.      Per HPI unless specifically indicated above     Objective:    BP 115/64   Pulse 72   Temp 97.9 F (36.6 C)   Wt (!) 359 lb 3.2 oz (162.9 kg)   SpO2 97%   BMI 48.72 kg/m   Wt Readings from Last 3 Encounters:  03/13/22 (!) 359 lb 3.2 oz (162.9 kg)  01/10/22 (!) 381 lb 3.2 oz (172.9 kg)  12/20/21 (!) 385 lb (174.6 kg)    Physical Exam Vitals and nursing note reviewed.  Constitutional:      General: He is not in acute distress.    Appearance: Normal appearance. He is obese. He is not ill-appearing, toxic-appearing or diaphoretic.  HENT:     Head: Normocephalic and atraumatic.     Right Ear: External ear normal.     Left Ear: External ear normal.     Nose: Nose normal.     Mouth/Throat:  Mouth: Mucous membranes are moist.     Pharynx: Oropharynx is clear.  Eyes:     General: No scleral icterus.       Right eye: No discharge.        Left eye: No discharge.     Extraocular Movements: Extraocular movements intact.     Conjunctiva/sclera: Conjunctivae normal.     Pupils: Pupils are equal, round, and reactive to light.  Cardiovascular:     Rate and Rhythm: Normal rate and regular rhythm.     Pulses: Normal pulses.     Heart sounds: Normal heart sounds.  No murmur heard.    No friction rub. No gallop.  Pulmonary:     Effort: Pulmonary effort is normal. No respiratory distress.     Breath sounds: Normal breath sounds. No stridor. No wheezing, rhonchi or rales.  Chest:     Chest wall: No tenderness.  Musculoskeletal:        General: Normal range of motion.     Cervical back: Normal range of motion and neck supple.  Skin:    General: Skin is warm and dry.     Capillary Refill: Capillary refill takes less than 2 seconds.     Coloration: Skin is not jaundiced or pale.     Findings: No bruising, erythema, lesion or rash.  Neurological:     General: No focal deficit present.     Mental Status: He is alert and oriented to person, place, and time. Mental status is at baseline.  Psychiatric:        Mood and Affect: Mood normal.        Behavior: Behavior normal.        Thought Content: Thought content normal.        Judgment: Judgment normal.     Results for orders placed or performed in visit on 03/13/22  Bayer DCA Hb A1c Waived  Result Value Ref Range   HB A1C (BAYER DCA - WAIVED) 6.0 (H) 4.8 - 5.6 %      Assessment & Plan:   Problem List Items Addressed This Visit       Respiratory   OSA (obstructive sleep apnea)    Tolerating CPAP well. Feeling well. Continue CPAP.        Endocrine   DM II (diabetes mellitus, type II), controlled (HCC) - Primary    Doing great with a1c of 6.0. Continue current regimen. Continue to monitor.       Relevant Medications   lisinopril (ZESTRIL) 5 MG tablet   aspirin EC 81 MG tablet   Other Relevant Orders   Bayer DCA Hb A1c Waived (Completed)   Lipid Panel w/o Chol/HDL Ratio   Comprehensive metabolic panel     Other   Morbid obesity (HCC)    Congratulated patient on 18lb weight loss. Continue diet and exercise. Call with any concerns. Recheck 3 months.       Hyperlipidemia    Under good control on current regimen. Continue current regimen. Continue to monitor. Call with any concerns.  Refills up to date. Labs checked today.       Relevant Medications   lisinopril (ZESTRIL) 5 MG tablet   aspirin EC 81 MG tablet   Other Relevant Orders   Lipid Panel w/o Chol/HDL Ratio   Comprehensive metabolic panel     Follow up plan: Return in about 3 months (around 06/13/2022).

## 2022-03-13 NOTE — Assessment & Plan Note (Signed)
Congratulated patient on 18lb weight loss. Continue diet and exercise. Call with any concerns. Recheck 3 months.

## 2022-03-13 NOTE — Assessment & Plan Note (Signed)
Under good control on current regimen. Continue current regimen. Continue to monitor. Call with any concerns. Refills up to date. Labs checked today.   

## 2022-03-13 NOTE — Assessment & Plan Note (Signed)
Doing great with a1c of 6.0. Continue current regimen. Continue to monitor.

## 2022-03-14 ENCOUNTER — Ambulatory Visit (INDEPENDENT_AMBULATORY_CARE_PROVIDER_SITE_OTHER): Payer: BC Managed Care – PPO | Admitting: Neurology

## 2022-03-14 ENCOUNTER — Encounter: Payer: Self-pay | Admitting: Neurology

## 2022-03-14 VITALS — BP 119/72 | HR 62 | Ht 72.0 in | Wt 363.0 lb

## 2022-03-14 DIAGNOSIS — I63211 Cerebral infarction due to unspecified occlusion or stenosis of right vertebral arteries: Secondary | ICD-10-CM | POA: Diagnosis not present

## 2022-03-14 LAB — LIPID PANEL W/O CHOL/HDL RATIO
Cholesterol, Total: 123 mg/dL (ref 100–199)
HDL: 40 mg/dL (ref >39)
LDL Chol Calc (NIH): 70 mg/dL (ref 0–99)
Triglycerides: 63 mg/dL (ref 0–149)
VLDL Cholesterol Cal: 13 mg/dL (ref 5–40)

## 2022-03-14 LAB — COMPREHENSIVE METABOLIC PANEL
ALT: 29 IU/L (ref 0–44)
AST: 16 IU/L (ref 0–40)
Albumin/Globulin Ratio: 1.6 (ref 1.2–2.2)
Albumin: 4.5 g/dL (ref 4.1–5.1)
Alkaline Phosphatase: 121 IU/L (ref 44–121)
BUN/Creatinine Ratio: 14 (ref 9–20)
BUN: 13 mg/dL (ref 6–20)
Bilirubin Total: 0.4 mg/dL (ref 0.0–1.2)
CO2: 23 mmol/L (ref 20–29)
Calcium: 9.6 mg/dL (ref 8.7–10.2)
Chloride: 102 mmol/L (ref 96–106)
Creatinine, Ser: 0.9 mg/dL (ref 0.76–1.27)
Globulin, Total: 2.8 g/dL (ref 1.5–4.5)
Glucose: 84 mg/dL (ref 70–99)
Potassium: 4.8 mmol/L (ref 3.5–5.2)
Sodium: 140 mmol/L (ref 134–144)
Total Protein: 7.3 g/dL (ref 6.0–8.5)
eGFR: 112 mL/min/{1.73_m2} (ref >59)

## 2022-03-14 NOTE — Patient Instructions (Signed)
Continue with Plavix for 30 days then discontinued Continue with aspirin 81 mg daily Continue other medications Continue with physical exercise Continue to follow with your PCP Return as needed
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
## 2022-03-15 ENCOUNTER — Encounter: Payer: BC Managed Care – PPO | Admitting: Family Medicine

## 2022-03-18 ENCOUNTER — Encounter: Payer: Self-pay | Admitting: Family Medicine

## 2022-03-18 ENCOUNTER — Ambulatory Visit (INDEPENDENT_AMBULATORY_CARE_PROVIDER_SITE_OTHER): Payer: BC Managed Care – PPO | Admitting: Family Medicine

## 2022-03-18 VITALS — BP 112/70 | HR 72 | Temp 98.0°F | Ht 73.5 in | Wt 359.2 lb

## 2022-03-18 DIAGNOSIS — Z0000 Encounter for general adult medical examination without abnormal findings: Secondary | ICD-10-CM

## 2022-03-18 LAB — URINALYSIS, ROUTINE W REFLEX MICROSCOPIC
Bilirubin, UA: NEGATIVE
Glucose, UA: NEGATIVE
Ketones, UA: NEGATIVE
Leukocytes,UA: NEGATIVE
Nitrite, UA: NEGATIVE
Protein,UA: NEGATIVE
RBC, UA: NEGATIVE
Specific Gravity, UA: 1.025 (ref 1.005–1.030)
Urobilinogen, Ur: 0.2 mg/dL (ref 0.2–1.0)
pH, UA: 5.5 (ref 5.0–7.5)

## 2022-03-18 LAB — MICROALBUMIN, URINE WAIVED
Creatinine, Urine Waived: 200 mg/dL (ref 10–300)
Microalb, Ur Waived: 10 mg/L (ref 0–19)
Microalb/Creat Ratio: <30 mg/g (ref <30)
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
## 2022-03-19 LAB — CBC WITH DIFFERENTIAL/PLATELET
Basophils Absolute: 0 10*3/uL (ref 0.0–0.2)
Basos: 0 %
EOS (ABSOLUTE): 0.1 10*3/uL (ref 0.0–0.4)
Eos: 1 %
Hematocrit: 39.3 % (ref 37.5–51.0)
Hemoglobin: 13.1 g/dL (ref 13.0–17.7)
Immature Grans (Abs): 0 10*3/uL (ref 0.0–0.1)
Immature Granulocytes: 0 %
Lymphocytes Absolute: 1.6 10*3/uL (ref 0.7–3.1)
Lymphs: 20 %
MCH: 28.2 pg (ref 26.6–33.0)
MCHC: 33.3 g/dL (ref 31.5–35.7)
MCV: 85 fL (ref 79–97)
Monocytes Absolute: 0.4 10*3/uL (ref 0.1–0.9)
Monocytes: 5 %
Neutrophils Absolute: 5.7 10*3/uL (ref 1.4–7.0)
Neutrophils: 74 %
Platelets: 243 10*3/uL (ref 150–450)
RBC: 4.65 x10E6/uL (ref 4.14–5.80)
RDW: 13.5 % (ref 11.6–15.4)
WBC: 7.8 10*3/uL (ref 3.4–10.8)

## 2022-03-19 LAB — TSH: TSH: 2.16 u[IU]/mL (ref 0.450–4.500)

## 2022-03-22 LAB — HM DIABETES EYE EXAM

## 2022-04-07 ENCOUNTER — Encounter: Payer: Self-pay | Admitting: Family Medicine

## 2022-06-17 ENCOUNTER — Ambulatory Visit (INDEPENDENT_AMBULATORY_CARE_PROVIDER_SITE_OTHER): Payer: BC Managed Care – PPO | Admitting: Family Medicine

## 2022-06-17 ENCOUNTER — Encounter: Payer: Self-pay | Admitting: Family Medicine

## 2022-06-17 VITALS — BP 125/78 | HR 74 | Temp 98.8°F | Ht 73.5 in | Wt 357.4 lb

## 2022-06-17 DIAGNOSIS — E1165 Type 2 diabetes mellitus with hyperglycemia: Secondary | ICD-10-CM | POA: Diagnosis not present

## 2022-06-17 LAB — BAYER DCA HB A1C WAIVED: HB A1C (BAYER DCA - WAIVED): 6.1 % — ABNORMAL HIGH (ref 4.8–5.6)

## 2022-06-17 MED ORDER — ATORVASTATIN CALCIUM 20 MG PO TABS: 20.0000 mg | ORAL_TABLET | Freq: Every day | ORAL | 1 refills | Status: DC

## 2022-06-17 MED ORDER — OZEMPIC (0.25 OR 0.5 MG/DOSE) 2 MG/3ML ~~LOC~~ SOPN: 0.2500 mg | PEN_INJECTOR | SUBCUTANEOUS | 1 refills | Status: DC

## 2022-06-17 MED ORDER — LISINOPRIL 5 MG PO TABS: 5.0000 mg | ORAL_TABLET | Freq: Every day | ORAL | 1 refills | Status: DC

## 2022-06-17 MED ORDER — METFORMIN HCL 1000 MG PO TABS
1000.0000 mg | ORAL_TABLET | Freq: Every day | ORAL | 1 refills | Status: DC
Start: 2022-06-17 — End: 2022-09-17

## 2022-06-17 NOTE — Assessment & Plan Note (Signed)
Rechecking labs today. Await results. Will cut him metformin in 1/2 and start ozempic. Call with any concerns. Recheck 1 month.

## 2022-06-17 NOTE — Progress Notes (Signed)
BP 125/78   Pulse 74   Temp 98.8 F (37.1 C) (Oral)   Ht 6' 1.5" (1.867 m)   Wt (!) 357 lb 6.4 oz (162.1 kg)   SpO2 98%   BMI 46.51 kg/m    Subjective:    Patient ID: Jimmy Flores, male    DOB: Nov 23, 1985, 36 y.o.   MRN: [id number]  HPI: Jimmy Flores is a 36 y.o. male  Chief Complaint  Patient presents with   Diabetes   DIABETES Hypoglycemic episodes:no Polydipsia/polyuria: no Visual disturbance: no Chest pain: no Paresthesias: no Glucose Monitoring: occasionally Taking Insulin?: no Blood Pressure Monitoring: not checking Retinal Examination: Up to Date Foot Exam: Up to Date Diabetic Education: Completed Pneumovax: Up to Date Influenza: Up to Date Aspirin: no  Relevant past medical, surgical, family and social history reviewed and updated as indicated. Interim medical history since our last visit reviewed. Allergies and medications reviewed and updated.  Review of Systems  Constitutional: Negative.   Respiratory: Negative.    Cardiovascular: Negative.   Gastrointestinal: Negative.   Musculoskeletal: Negative.   Psychiatric/Behavioral: Negative.      Per HPI unless specifically indicated above     Objective:    BP 125/78   Pulse 74   Temp 98.8 F (37.1 C) (Oral)   Ht 6' 1.5" (1.867 m)   Wt (!) 357 lb 6.4 oz (162.1 kg)   SpO2 98%   BMI 46.51 kg/m   Wt Readings from Last 3 Encounters:  06/17/22 (!) 357 lb 6.4 oz (162.1 kg)  03/18/22 (!) 359 lb 3.2 oz (162.9 kg)  03/14/22 (!) 363 lb (164.7 kg)    Physical Exam Vitals and nursing note reviewed.  Constitutional:      General: He is not in acute distress.    Appearance: Normal appearance. He is not ill-appearing, toxic-appearing or diaphoretic.  HENT:     Head: Normocephalic and atraumatic.     Right Ear: External ear normal.     Left Ear: External ear normal.     Nose: Nose normal.     Mouth/Throat:     Mouth: Mucous membranes are moist.     Pharynx: Oropharynx is clear.  Eyes:      General: No scleral icterus.       Right eye: No discharge.        Left eye: No discharge.     Extraocular Movements: Extraocular movements intact.     Conjunctiva/sclera: Conjunctivae normal.     Pupils: Pupils are equal, round, and reactive to light.  Cardiovascular:     Rate and Rhythm: Normal rate and regular rhythm.     Pulses: Normal pulses.     Heart sounds: Normal heart sounds. No murmur heard.    No friction rub. No gallop.  Pulmonary:     Effort: Pulmonary effort is normal. No respiratory distress.     Breath sounds: Normal breath sounds. No stridor. No wheezing, rhonchi or rales.  Chest:     Chest wall: No tenderness.  Musculoskeletal:        General: Normal range of motion.     Cervical back: Normal range of motion and neck supple.  Skin:    General: Skin is warm and dry.     Capillary Refill: Capillary refill takes less than 2 seconds.     Coloration: Skin is not jaundiced or pale.     Findings: No bruising, erythema, lesion or rash.  Neurological:     General: No focal deficit present.  Mental Status: He is alert and oriented to person, place, and time. Mental status is at baseline.  Psychiatric:        Mood and Affect: Mood normal.        Behavior: Behavior normal.        Thought Content: Thought content normal.        Judgment: Judgment normal.     Results for orders placed or performed in visit on 03/26/22  HM DIABETES EYE EXAM  Result Value Ref Range   HM Diabetic Eye Exam No Retinopathy No Retinopathy      Assessment & Plan:   Problem List Items Addressed This Visit       Endocrine   DM II (diabetes mellitus, type II), controlled ([facility]) - Primary    Rechecking labs today. Await results. Will cut him metformin in 1/2 and start ozempic. Call with any concerns. Recheck 1 month.       Relevant Medications   atorvastatin (LIPITOR) 20 MG tablet   Semaglutide,0.25 or 0.5MG /DOS, (OZEMPIC, 0.25 OR 0.5 MG/DOSE,) 2 MG/3ML SOPN   metFORMIN (GLUCOPHAGE)  1000 MG tablet   lisinopril (ZESTRIL) 5 MG tablet   Other Relevant Orders   Bayer DCA Hb A1c Waived     Follow up plan: Return in about 3 months (around 09/17/2022).

## 2022-07-30 ENCOUNTER — Telehealth: Payer: Self-pay | Admitting: Family Medicine

## 2022-07-30 NOTE — Telephone Encounter (Signed)
Semaglutide,0.25 or 0.5MG /DOS, (OZEMPIC, 0.25 OR 0.5 MG/DOSE,) 2 MG/3ML SOPN 3 mL 1 06/17/2022    Sig - Route: Inject 0.25 mg into the skin once a week. - Subcutaneous   Pt was to start on this in Nov 27. States that for a while was checking back and forth with pharmacy and they said they had sent paperwork to provider as ins did not approve. States was take double Metformin till approved. Pt states had come by the office a couple times. Pt is now checking back again for status, agent having trouble finding documentation? Please advise pt at [phone]

## 2022-07-31 NOTE — Telephone Encounter (Signed)
Please start PA

## 2022-07-31 NOTE — Telephone Encounter (Signed)
Prior authorization was initiated via CoverMyMeds for prescription of Ozempic. Awaiting determination from insurance.

## 2022-08-01 NOTE — Telephone Encounter (Signed)
Office has received fax requesting office notes and patient most recent A1c results for determination on patient's prior authorization for Ozempic. All requested paperwork was faxed back to [employer] at [phone]. Awaiting determination from patient's insurance.

## 2022-08-02 ENCOUNTER — Other Ambulatory Visit: Payer: Self-pay | Admitting: Family Medicine

## 2022-08-02 DIAGNOSIS — E1165 Type 2 diabetes mellitus with hyperglycemia: Secondary | ICD-10-CM

## 2022-08-02 NOTE — Telephone Encounter (Signed)
He failed metformin. Please find out what other steps they need.

## 2022-08-02 NOTE — Telephone Encounter (Signed)
Called and spoke with patient's insurance pharmacy benefits due to patient being denied due to not trying step therapy. When speaking with representative was later informed that patient was denied due to patient A1c not being checked since 06/17/22 they will not cover Ozempic as they do not have an A1c from 2024.

## 2022-08-02 NOTE — Telephone Encounter (Signed)
Please let patient know that is insurance is being ridiculous. Please have him come in for a lab only visit for A1c ASAP.

## 2022-08-02 NOTE — Telephone Encounter (Signed)
Received fax from [employer] for determination of PA for Ozempic. [employer] stating patient was denied due to not trying step therapy. Please advise?

## 2022-08-02 NOTE — Telephone Encounter (Signed)
Spoke with patient to notify him of Dr.Johnson's recommendations. Patient verbalized understanding. Patient is scheduled for 08/07/22 at 8:20 AM.

## 2022-08-07 ENCOUNTER — Other Ambulatory Visit: Payer: BC Managed Care – PPO

## 2022-08-07 LAB — BAYER DCA HB A1C WAIVED: HB A1C (BAYER DCA - WAIVED): 6.2 % — ABNORMAL HIGH (ref 4.8–5.6)

## 2022-08-08 ENCOUNTER — Encounter: Payer: Self-pay | Admitting: Family Medicine

## 2022-08-12 ENCOUNTER — Telehealth: Payer: Self-pay

## 2022-08-12 NOTE — Telephone Encounter (Signed)
Note written- please send to insurance.

## 2022-08-12 NOTE — Telephone Encounter (Signed)
Letter printed and have been faxed to patient's insurance clinical team at [phone]. Awaiting determination from patient's insurance.

## 2022-09-17 ENCOUNTER — Ambulatory Visit (INDEPENDENT_AMBULATORY_CARE_PROVIDER_SITE_OTHER): Payer: BC Managed Care – PPO | Admitting: Family Medicine

## 2022-09-17 ENCOUNTER — Encounter: Payer: Self-pay | Admitting: Family Medicine

## 2022-09-17 VITALS — BP 134/80 | HR 72 | Temp 98.6°F | Ht 73.5 in | Wt 369.0 lb

## 2022-09-17 DIAGNOSIS — E782 Mixed hyperlipidemia: Secondary | ICD-10-CM

## 2022-09-17 DIAGNOSIS — E1165 Type 2 diabetes mellitus with hyperglycemia: Secondary | ICD-10-CM

## 2022-09-17 LAB — BAYER DCA HB A1C WAIVED: HB A1C (BAYER DCA - WAIVED): 6.2 % — ABNORMAL HIGH (ref 4.8–5.6)

## 2022-09-17 MED ORDER — METFORMIN HCL 1000 MG PO TABS
1000.0000 mg | ORAL_TABLET | Freq: Every day | ORAL | 1 refills | Status: DC
Start: 2022-09-17 — End: 2023-03-19

## 2022-09-17 MED ORDER — "PEN NEEDLES 5/16"" 31G X 8 MM MISC": 1.0000 | 0 refills | Status: AC

## 2022-09-17 MED ORDER — OZEMPIC (0.25 OR 0.5 MG/DOSE) 2 MG/3ML ~~LOC~~ SOPN: 0.5000 mg | PEN_INJECTOR | SUBCUTANEOUS | 1 refills | Status: DC

## 2022-09-17 MED ORDER — LISINOPRIL 5 MG PO TABS: 5.0000 mg | ORAL_TABLET | Freq: Every day | ORAL | 1 refills | Status: DC

## 2022-09-17 MED ORDER — ATORVASTATIN CALCIUM 20 MG PO TABS
20.0000 mg | ORAL_TABLET | Freq: Every day | ORAL | 1 refills | Status: DC
Start: 2022-09-17 — End: 2023-03-19

## 2022-09-17 NOTE — Progress Notes (Signed)
BP 134/80   Pulse 72   Temp 98.6 F (37 C) (Oral)   Ht 6' 1.5" (1.867 m)   Wt (!) 369 lb (167.4 kg)   SpO2 95%   BMI 48.02 kg/m    Subjective:    Patient ID: Jimmy Flores, male    DOB: 17-Nov-1985, 37 y.o.   MRN: [id number]  HPI: [name] is a 37 y.o. male  Chief Complaint  Patient presents with   Diabetes   DIABETES Hypoglycemic episodes:no Polydipsia/polyuria: no Visual disturbance: no Chest pain: no Paresthesias: no Glucose Monitoring: no  Accucheck frequency: Not Checking Taking Insulin?: no Blood Pressure Monitoring: not checking Retinal Examination: Up to Date Foot Exam: Up to Date Diabetic Education: Completed Pneumovax: Up to Date Influenza: Up to Date Aspirin: no  HYPERTENSION / HYPERLIPIDEMIA Satisfied with current treatment? yes Duration of hypertension: chronic BP monitoring frequency: rarely BP medication side effects: no Past BP meds: lisinopril Duration of hyperlipidemia: chronic Cholesterol medication side effects: no Cholesterol supplements: none Past cholesterol medications: atorvastatin Medication compliance: excellent compliance Aspirin: yes Recent stressors: no Recurrent headaches: no Visual changes: no Palpitations: no Dyspnea: no Chest pain: no Lower extremity edema: no Dizzy/lightheaded: no   Relevant past medical, surgical, family and social history reviewed and updated as indicated. Interim medical history since our last visit reviewed. Allergies and medications reviewed and updated.  Review of Systems  Constitutional: Negative.   Respiratory: Negative.    Cardiovascular: Negative.   Gastrointestinal: Negative.   Musculoskeletal: Negative.   Psychiatric/Behavioral: Negative.      Per HPI unless specifically indicated above     Objective:    BP 134/80   Pulse 72   Temp 98.6 F (37 C) (Oral)   Ht 6' 1.5" (1.867 m)   Wt (!) 369 lb (167.4 kg)   SpO2 95%   BMI 48.02 kg/m   Wt Readings from Last 3  Encounters:  09/17/22 (!) 369 lb (167.4 kg)  06/17/22 (!) 357 lb 6.4 oz (162.1 kg)  03/18/22 (!) 359 lb 3.2 oz (162.9 kg)    Physical Exam Vitals and nursing note reviewed.  Constitutional:      General: He is not in acute distress.    Appearance: Normal appearance. He is not ill-appearing, toxic-appearing or diaphoretic.  HENT:     Head: Normocephalic and atraumatic.     Right Ear: External ear normal.     Left Ear: External ear normal.     Nose: Nose normal.     Mouth/Throat:     Mouth: Mucous membranes are moist.     Pharynx: Oropharynx is clear.  Eyes:     General: No scleral icterus.       Right eye: No discharge.        Left eye: No discharge.     Extraocular Movements: Extraocular movements intact.     Conjunctiva/sclera: Conjunctivae normal.     Pupils: Pupils are equal, round, and reactive to light.  Cardiovascular:     Rate and Rhythm: Normal rate and regular rhythm.     Pulses: Normal pulses.     Heart sounds: Normal heart sounds. No murmur heard.    No friction rub. No gallop.  Pulmonary:     Effort: Pulmonary effort is normal. No respiratory distress.     Breath sounds: Normal breath sounds. No stridor. No wheezing, rhonchi or rales.  Chest:     Chest wall: No tenderness.  Musculoskeletal:        General: Normal range  of motion.     Cervical back: Normal range of motion and neck supple.  Skin:    General: Skin is warm and dry.     Capillary Refill: Capillary refill takes less than 2 seconds.     Coloration: Skin is not jaundiced or pale.     Findings: No bruising, erythema, lesion or rash.  Neurological:     General: No focal deficit present.     Mental Status: He is alert and oriented to person, place, and time. Mental status is at baseline.  Psychiatric:        Mood and Affect: Mood normal.        Behavior: Behavior normal.        Thought Content: Thought content normal.        Judgment: Judgment normal.     Results for orders placed or performed  in visit on 08/02/22  Bayer DCA Hb A1c Waived  Result Value Ref Range   HB A1C (BAYER DCA - WAIVED) 6.2 (H) 4.8 - 5.6 %      Assessment & Plan:   Problem List Items Addressed This Visit       Endocrine   DM II (diabetes mellitus, type II), controlled ([place]) - Primary    A1c stable at 6.2- will increase his ozempic to 0.'5mg'$  and continue metformin. Recheck 3 months. Call with any concerns.       Relevant Medications   Semaglutide,0.25 or 0.'5MG'$ /DOS, (OZEMPIC, 0.25 OR 0.5 MG/DOSE,) 2 MG/3ML SOPN   atorvastatin (LIPITOR) 20 MG tablet   lisinopril (ZESTRIL) 5 MG tablet   metFORMIN (GLUCOPHAGE) 1000 MG tablet   Other Relevant Orders   Bayer DCA Hb A1c Waived   Comprehensive metabolic panel   Lipid Panel w/o Chol/HDL Ratio     Other   Hyperlipidemia    Under good control on current regimen. Continue current regimen. Continue to monitor. Call with any concerns. Refills given. Labs drawn today.       Relevant Medications   atorvastatin (LIPITOR) 20 MG tablet   lisinopril (ZESTRIL) 5 MG tablet     Follow up plan: Return in about 3 months (around 12/16/2022).

## 2022-09-17 NOTE — Assessment & Plan Note (Signed)
A1c stable at 6.2- will increase his ozempic to 0.'5mg'$  and continue metformin. Recheck 3 months. Call with any concerns.

## 2022-09-17 NOTE — Assessment & Plan Note (Signed)
Under good control on current regimen. Continue current regimen. Continue to monitor. Call with any concerns. Refills given. Labs drawn today.   

## 2022-09-18 LAB — COMPREHENSIVE METABOLIC PANEL
ALT: 32 IU/L (ref 0–44)
AST: 16 IU/L (ref 0–40)
Albumin/Globulin Ratio: 1.8 (ref 1.2–2.2)
Albumin: 4.3 g/dL (ref 4.1–5.1)
Alkaline Phosphatase: 106 IU/L (ref 44–121)
BUN/Creatinine Ratio: 15 (ref 9–20)
BUN: 13 mg/dL (ref 6–20)
Bilirubin Total: 0.3 mg/dL (ref 0.0–1.2)
CO2: 22 mmol/L (ref 20–29)
Calcium: 9.2 mg/dL (ref 8.7–10.2)
Chloride: 103 mmol/L (ref 96–106)
Creatinine, Ser: 0.85 mg/dL (ref 0.76–1.27)
Globulin, Total: 2.4 g/dL (ref 1.5–4.5)
Glucose: 100 mg/dL — ABNORMAL HIGH (ref 70–99)
Potassium: 4.6 mmol/L (ref 3.5–5.2)
Sodium: 140 mmol/L (ref 134–144)
Total Protein: 6.7 g/dL (ref 6.0–8.5)
eGFR: 114 mL/min/{1.73_m2} (ref >59)

## 2022-09-18 LAB — LIPID PANEL W/O CHOL/HDL RATIO
Cholesterol, Total: 150 mg/dL (ref 100–199)
HDL: 46 mg/dL (ref >39)
LDL Chol Calc (NIH): 91 mg/dL (ref 0–99)
Triglycerides: 64 mg/dL (ref 0–149)
VLDL Cholesterol Cal: 13 mg/dL (ref 5–40)

## 2022-09-27 ENCOUNTER — Other Ambulatory Visit: Payer: Self-pay | Admitting: Family Medicine

## 2022-09-27 NOTE — Telephone Encounter (Signed)
Medication Refill - Medication: Ozempic   Has the patient contacted their pharmacy? Yes.   (Agent: If no, request that the patient contact the pharmacy for the refill. If patient does not wish to contact the pharmacy document the reason why and proceed with request.) (Agent: If yes, when and what did the pharmacy advise?)  Preferred Pharmacy (with phone number or street name): [facility] [address] [place] Has the patient been seen for an appointment in the last year OR does the patient have an upcoming appointment? Yes.    Agent: Please be advised that RX refills may take up to 3 business days. We ask that you follow-up with your pharmacy.

## 2022-09-27 NOTE — Telephone Encounter (Signed)
Disp Refills Start End   Semaglutide,0.25 or 0.'5MG'$ /DOS, (OZEMPIC, 0.25 OR 0.5 MG/DOSE,) 2 MG/3ML SOPN 6 mL 1 09/17/2022    Sig - Route: Inject 0.5 mg into the skin once a week. - Subcutaneous   Sent to pharmacy as: Semaglutide,0.25 or 0.'5MG'$ /DOS, (OZEMPIC, 0.25 OR 0.5 MG/DOSE,) 2 MG/3ML Solution Pen-injector   E-Prescribing Status: Receipt confirmed by pharmacy (09/17/2022  9:34 AM EST)    Requested Prescriptions  Refused Prescriptions Disp Refills   Semaglutide,0.25 or 0.'5MG'$ /DOS, (OZEMPIC, 0.25 OR 0.5 MG/DOSE,) 2 MG/3ML SOPN 6 mL 1    Sig: Inject 0.5 mg into the skin once a week.     Endocrinology:  Diabetes - GLP-1 Receptor Agonists - semaglutide Failed - 09/27/2022  4:50 PM      Failed - HBA1C in normal range and within 180 days    HB A1C (BAYER DCA - WAIVED)  Date Value Ref Range Status  09/17/2022 6.2 (H) 4.8 - 5.6 % Final    Comment:             Prediabetes: 5.7 - 6.4          Diabetes: >6.4          Glycemic control for adults with diabetes: <7.0          Passed - Cr in normal range and within 360 days    Creatinine, Ser  Date Value Ref Range Status  09/17/2022 0.85 0.76 - 1.27 mg/dL Final         Passed - Valid encounter within last 6 months    Recent Outpatient Visits           1 week ago Controlled type 2 diabetes mellitus with hyperglycemia, without long-term current use of insulin ([place])   [place], Megan P, DO   3 months ago Controlled type 2 diabetes mellitus with hyperglycemia, without long-term current use of insulin ([place])   [place], Megan P, DO   6 months ago Routine general medical examination at a health care facility   [facility], [place] P, DO   6 months ago Controlled type 2 diabetes mellitus with hyperglycemia, without long-term current use of insulin [facility])   [place], Megan P, DO   8 months ago History of stroke with  residual deficit   [place], Megan P, DO       Future Appointments             In 2 months Wynetta Emery, Barb Merino, DO [place], [facility]

## 2022-12-17 ENCOUNTER — Ambulatory Visit: Payer: BC Managed Care – PPO | Admitting: Family Medicine

## 2022-12-18 ENCOUNTER — Ambulatory Visit (INDEPENDENT_AMBULATORY_CARE_PROVIDER_SITE_OTHER): Payer: BC Managed Care – PPO | Admitting: Family Medicine

## 2022-12-18 ENCOUNTER — Encounter: Payer: Self-pay | Admitting: Family Medicine

## 2022-12-18 VITALS — BP 131/74 | HR 75 | Temp 98.2°F | Ht 73.5 in | Wt 379.6 lb

## 2022-12-18 DIAGNOSIS — Z7985 Long-term (current) use of injectable non-insulin antidiabetic drugs: Secondary | ICD-10-CM

## 2022-12-18 DIAGNOSIS — E1165 Type 2 diabetes mellitus with hyperglycemia: Secondary | ICD-10-CM

## 2022-12-18 LAB — BAYER DCA HB A1C WAIVED: HB A1C (BAYER DCA - WAIVED): 6.6 % — ABNORMAL HIGH (ref 4.8–5.6)

## 2022-12-18 MED ORDER — SEMAGLUTIDE (1 MG/DOSE) 4 MG/3ML ~~LOC~~ SOPN: 1.0000 mg | PEN_INJECTOR | SUBCUTANEOUS | 1 refills | Status: DC

## 2022-12-18 NOTE — Progress Notes (Signed)
BP 131/74   Pulse 75   Temp 98.2 F (36.8 C) (Oral)   Ht 6' 1.5" (1.867 m)   Wt (!) 379 lb 9.6 oz (172.2 kg)   SpO2 95%   BMI 49.40 kg/m    Subjective:    Patient ID: Jimmy Flores, male    DOB: Mar 08, 1986, 37 y.o.   MRN: [id number]  HPI: Jimmy Flores is a 37 y.o. male  Chief Complaint  Patient presents with   Diabetes   DIABETES- insurance has been difficult about him getting his ozempic Hypoglycemic episodes:no Polydipsia/polyuria: no Visual disturbance: no Chest pain: no Paresthesias: no Glucose Monitoring: no  Accucheck frequency: Not Checking Taking Insulin?: no Blood Pressure Monitoring: not checking Retinal Examination: Up to Date Foot Exam: Up to Date Diabetic Education: Completed Pneumovax: Up to Date Influenza: Up to Date Aspirin: yes  Relevant past medical, surgical, family and social history reviewed and updated as indicated. Interim medical history since our last visit reviewed. Allergies and medications reviewed and updated.  Review of Systems  Constitutional: Negative.   Respiratory: Negative.    Cardiovascular: Negative.   Gastrointestinal: Negative.   Musculoskeletal: Negative.   Psychiatric/Behavioral: Negative.      Per HPI unless specifically indicated above     Objective:    BP 131/74   Pulse 75   Temp 98.2 F (36.8 C) (Oral)   Ht 6' 1.5" (1.867 m)   Wt (!) 379 lb 9.6 oz (172.2 kg)   SpO2 95%   BMI 49.40 kg/m   Wt Readings from Last 3 Encounters:  12/18/22 (!) 379 lb 9.6 oz (172.2 kg)  09/17/22 (!) 369 lb (167.4 kg)  06/17/22 (!) 357 lb 6.4 oz (162.1 kg)    Physical Exam Vitals and nursing note reviewed.  Constitutional:      General: He is not in acute distress.    Appearance: Normal appearance. He is obese. He is not ill-appearing, toxic-appearing or diaphoretic.  HENT:     Head: Normocephalic and atraumatic.     Right Ear: External ear normal.     Left Ear: External ear normal.     Nose: Nose normal.      Mouth/Throat:     Mouth: Mucous membranes are moist.     Pharynx: Oropharynx is clear.  Eyes:     General: No scleral icterus.       Right eye: No discharge.        Left eye: No discharge.     Extraocular Movements: Extraocular movements intact.     Conjunctiva/sclera: Conjunctivae normal.     Pupils: Pupils are equal, round, and reactive to light.  Cardiovascular:     Rate and Rhythm: Normal rate and regular rhythm.     Pulses: Normal pulses.     Heart sounds: Normal heart sounds. No murmur heard.    No friction rub. No gallop.  Pulmonary:     Effort: Pulmonary effort is normal. No respiratory distress.     Breath sounds: Normal breath sounds. No stridor. No wheezing, rhonchi or rales.  Chest:     Chest wall: No tenderness.  Musculoskeletal:        General: Normal range of motion.     Cervical back: Normal range of motion and neck supple.  Skin:    General: Skin is warm and dry.     Capillary Refill: Capillary refill takes less than 2 seconds.     Coloration: Skin is not jaundiced or pale.     Findings:  No bruising, erythema, lesion or rash.  Neurological:     General: No focal deficit present.     Mental Status: He is alert and oriented to person, place, and time. Mental status is at baseline.  Psychiatric:        Mood and Affect: Mood normal.        Behavior: Behavior normal.        Thought Content: Thought content normal.        Judgment: Judgment normal.     Results for orders placed or performed in visit on 09/17/22  Bayer DCA Hb A1c Waived  Result Value Ref Range   HB A1C (BAYER DCA - WAIVED) 6.2 (H) 4.8 - 5.6 %  Comprehensive metabolic panel  Result Value Ref Range   Glucose 100 (H) 70 - 99 mg/dL   BUN 13 6 - 20 mg/dL   Creatinine, Ser 1.61 0.76 - 1.27 mg/dL   eGFR 096 >04 VW/UJW/1.19   BUN/Creatinine Ratio 15 9 - 20   Sodium 140 134 - 144 mmol/L   Potassium 4.6 3.5 - 5.2 mmol/L   Chloride 103 96 - 106 mmol/L   CO2 22 20 - 29 mmol/L   Calcium 9.2 8.7 -  10.2 mg/dL   Total Protein 6.7 6.0 - 8.5 g/dL   Albumin 4.3 4.1 - 5.1 g/dL   Globulin, Total 2.4 1.5 - 4.5 g/dL   Albumin/Globulin Ratio 1.8 1.2 - 2.2   Bilirubin Total 0.3 0.0 - 1.2 mg/dL   Alkaline Phosphatase 106 44 - 121 IU/L   AST 16 0 - 40 IU/L   ALT 32 0 - 44 IU/L  Lipid Panel w/o Chol/HDL Ratio  Result Value Ref Range   Cholesterol, Total 150 100 - 199 mg/dL   Triglycerides 64 0 - 149 mg/dL   HDL 46 >14 mg/dL   VLDL Cholesterol Cal 13 5 - 40 mg/dL   LDL Chol Calc (NIH) 91 0 - 99 mg/dL      Assessment & Plan:   Problem List Items Addressed This Visit       Endocrine   DM II (diabetes mellitus, type II), controlled (HCC) - Primary    Up slightly with A1c of 6.6. Will increase his ozempic to 1mg  and recheck 3 months. Call with any concerns.       Relevant Medications   Semaglutide, 1 MG/DOSE, 4 MG/3ML SOPN   Other Relevant Orders   Bayer DCA Hb A1c Waived     Follow up plan: Return in about 3 months (around 03/20/2023).

## 2022-12-18 NOTE — Assessment & Plan Note (Signed)
Up slightly with A1c of 6.6. Will increase his ozempic to 1mg  and recheck 3 months. Call with any concerns.

## 2022-12-23 ENCOUNTER — Telehealth: Payer: Self-pay | Admitting: Family Medicine

## 2022-12-23 NOTE — Telephone Encounter (Signed)
Copied from CRM [id number]. Topic: General - Other >> Dec 20, 2022  5:06 PM Epimenio Foot F wrote: Reason for CRM: [facility] is calling in because they sent over a medical records request and received a fax back but without the medical records. They are requesting the medical records be attached to the new fax they're sending over.

## 2022-12-23 NOTE — Telephone Encounter (Signed)
Re-faxed requested fax forms along with patient's last 3 clinical office notes and A1c lab results.

## 2023-02-18 ENCOUNTER — Encounter: Payer: Self-pay | Admitting: Family Medicine

## 2023-03-19 ENCOUNTER — Encounter: Payer: Self-pay | Admitting: Family Medicine

## 2023-03-19 ENCOUNTER — Ambulatory Visit (INDEPENDENT_AMBULATORY_CARE_PROVIDER_SITE_OTHER): Payer: BC Managed Care – PPO | Admitting: Family Medicine

## 2023-03-19 VITALS — BP 119/78 | HR 68 | Temp 98.2°F | Wt 358.8 lb

## 2023-03-19 DIAGNOSIS — Z0000 Encounter for general adult medical examination without abnormal findings: Secondary | ICD-10-CM

## 2023-03-19 DIAGNOSIS — E1165 Type 2 diabetes mellitus with hyperglycemia: Secondary | ICD-10-CM | POA: Diagnosis not present

## 2023-03-19 DIAGNOSIS — E782 Mixed hyperlipidemia: Secondary | ICD-10-CM

## 2023-03-19 DIAGNOSIS — M545 Low back pain, unspecified: Secondary | ICD-10-CM | POA: Diagnosis not present

## 2023-03-19 DIAGNOSIS — Z7984 Long term (current) use of oral hypoglycemic drugs: Secondary | ICD-10-CM

## 2023-03-19 LAB — URINALYSIS, ROUTINE W REFLEX MICROSCOPIC
Bilirubin, UA: NEGATIVE
Glucose, UA: NEGATIVE
Ketones, UA: NEGATIVE
Leukocytes,UA: NEGATIVE
Nitrite, UA: NEGATIVE
RBC, UA: NEGATIVE
Specific Gravity, UA: 1.025 (ref 1.005–1.030)
Urobilinogen, Ur: 1 mg/dL (ref 0.2–1.0)
pH, UA: 6 (ref 5.0–7.5)

## 2023-03-19 LAB — MICROALBUMIN, URINE WAIVED
Creatinine, Urine Waived: 300 mg/dL (ref 10–300)
Microalb, Ur Waived: 30 mg/L — ABNORMAL HIGH (ref 0–19)
Microalb/Creat Ratio: <30 mg/g (ref <30)

## 2023-03-19 LAB — BAYER DCA HB A1C WAIVED: HB A1C (BAYER DCA - WAIVED): 6 % — ABNORMAL HIGH (ref 4.8–5.6)

## 2023-03-19 MED ORDER — LISINOPRIL 5 MG PO TABS: 5.0000 mg | ORAL_TABLET | Freq: Every day | ORAL | 1 refills | Status: DC

## 2023-03-19 MED ORDER — ATORVASTATIN CALCIUM 20 MG PO TABS: 20.0000 mg | ORAL_TABLET | Freq: Every day | ORAL | 1 refills | Status: DC

## 2023-03-19 MED ORDER — METFORMIN HCL 1000 MG PO TABS: 1000.0000 mg | ORAL_TABLET | Freq: Every day | ORAL | 1 refills | Status: DC

## 2023-03-19 MED ORDER — SEMAGLUTIDE (1 MG/DOSE) 4 MG/3ML ~~LOC~~ SOPN: 1.0000 mg | PEN_INJECTOR | SUBCUTANEOUS | 0 refills | Status: DC

## 2023-03-19 MED ORDER — ASPIRIN 81 MG PO TBEC: 81.0000 mg | DELAYED_RELEASE_TABLET | Freq: Every day | ORAL | 4 refills | Status: DC

## 2023-03-19 MED ORDER — BACLOFEN 10 MG PO TABS: 10.0000 mg | ORAL_TABLET | Freq: Every evening | ORAL | 0 refills | Status: AC | PRN

## 2023-03-19 NOTE — Assessment & Plan Note (Signed)
Under good control on current regimen. Continue current regimen. Continue to monitor. Call with any concerns. Refills given. Labs drawn today.   

## 2023-03-19 NOTE — Assessment & Plan Note (Signed)
In exacerbation. Will continue diclofenac. Start baclofen. Call with any concerns.

## 2023-03-19 NOTE — Assessment & Plan Note (Signed)
Doing great with A1c of 6.0. Continue current regimen. Refills given today. Call with any concerns.
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
## 2023-03-19 NOTE — Assessment & Plan Note (Signed)
Congratulated patient on 21lb weight loss. Continue diet and and exercise. Call with any concerns.

## 2023-03-20 ENCOUNTER — Ambulatory Visit: Payer: BC Managed Care – PPO | Admitting: Family Medicine

## 2023-03-20 LAB — CBC WITH DIFFERENTIAL/PLATELET
Basophils Absolute: 0 10*3/uL (ref 0.0–0.2)
Basos: 1 %
EOS (ABSOLUTE): 0.2 10*3/uL (ref 0.0–0.4)
Eos: 3 %
Hematocrit: 43.5 % (ref 37.5–51.0)
Hemoglobin: 14.6 g/dL (ref 13.0–17.7)
Immature Grans (Abs): 0 10*3/uL (ref 0.0–0.1)
Immature Granulocytes: 0 %
Lymphocytes Absolute: 1.9 10*3/uL (ref 0.7–3.1)
Lymphs: 30 %
MCH: 28.3 pg (ref 26.6–33.0)
MCHC: 33.6 g/dL (ref 31.5–35.7)
MCV: 84 fL (ref 79–97)
Monocytes Absolute: 0.3 10*3/uL (ref 0.1–0.9)
Monocytes: 5 %
Neutrophils Absolute: 3.9 10*3/uL (ref 1.4–7.0)
Neutrophils: 61 %
Platelets: 243 10*3/uL (ref 150–450)
RBC: 5.16 x10E6/uL (ref 4.14–5.80)
RDW: 13.6 % (ref 11.6–15.4)
WBC: 6.3 10*3/uL (ref 3.4–10.8)

## 2023-03-20 LAB — COMPREHENSIVE METABOLIC PANEL
ALT: 38 IU/L (ref 0–44)
AST: 19 IU/L (ref 0–40)
Albumin: 4.3 g/dL (ref 4.1–5.1)
Alkaline Phosphatase: 113 IU/L (ref 44–121)
BUN/Creatinine Ratio: 15 (ref 9–20)
BUN: 14 mg/dL (ref 6–20)
Bilirubin Total: 0.5 mg/dL (ref 0.0–1.2)
CO2: 23 mmol/L (ref 20–29)
Calcium: 9.1 mg/dL (ref 8.7–10.2)
Chloride: 102 mmol/L (ref 96–106)
Creatinine, Ser: 0.92 mg/dL (ref 0.76–1.27)
Globulin, Total: 2.8 g/dL (ref 1.5–4.5)
Glucose: 87 mg/dL (ref 70–99)
Potassium: 4.8 mmol/L (ref 3.5–5.2)
Sodium: 140 mmol/L (ref 134–144)
Total Protein: 7.1 g/dL (ref 6.0–8.5)
eGFR: 109 mL/min/{1.73_m2} (ref >59)

## 2023-03-20 LAB — LIPID PANEL W/O CHOL/HDL RATIO
Cholesterol, Total: 159 mg/dL (ref 100–199)
HDL: 43 mg/dL (ref >39)
LDL Chol Calc (NIH): 104 mg/dL — ABNORMAL HIGH (ref 0–99)
Triglycerides: 58 mg/dL (ref 0–149)
VLDL Cholesterol Cal: 12 mg/dL (ref 5–40)

## 2023-03-20 LAB — TSH: TSH: 2.05 u[IU]/mL (ref 0.450–4.500)

## 2023-04-01 ENCOUNTER — Ambulatory Visit: Payer: BC Managed Care – PPO | Admitting: Family Medicine

## 2023-06-17 ENCOUNTER — Other Ambulatory Visit: Payer: Self-pay | Admitting: Family Medicine

## 2023-06-17 NOTE — Telephone Encounter (Signed)
Requested Prescriptions  Refused Prescriptions Disp Refills   OZEMPIC, 1 MG/DOSE, 4 MG/3ML SOPN [Pharmacy Med Name: OZEMPIC 1MG  PER DOSE (4MG /3ML) PFP] 9 mL 0    Sig: INJECT 1 MG UNDER THE SKIN ONE DAY A WEEK AS DIRECTED     Endocrinology:  Diabetes - GLP-1 Receptor Agonists - semaglutide Failed - 06/17/2023  8:04 AM      Failed - HBA1C in normal range and within 180 days    HB A1C (BAYER DCA - WAIVED)  Date Value Ref Range Status  03/19/2023 6.0 (H) 4.8 - 5.6 % Final    Comment:             Prediabetes: 5.7 - 6.4          Diabetes: >6.4          Glycemic control for adults with diabetes: <7.0          Passed - Cr in normal range and within 360 days    Creatinine, Ser  Date Value Ref Range Status  03/19/2023 0.92 0.76 - 1.27 mg/dL Final         Passed - Valid encounter within last 6 months    Recent Outpatient Visits           3 months ago Routine general medical examination at a health care facility   [facility], [place] P, DO   6 months ago Controlled type 2 diabetes mellitus with hyperglycemia, without long-term current use of insulin (HCC)   [place] [facility], Megan P, DO   9 months ago Controlled type 2 diabetes mellitus with hyperglycemia, without long-term current use of insulin (HCC)   [place] [facility] [place], Megan P, DO   1 year ago Controlled type 2 diabetes mellitus with hyperglycemia, without long-term current use of insulin (HCC)   [place] [facility] [place], Megan P, DO   1 year ago Routine general medical examination at a health care facility   [facility] Dorcas Carrow, DO       Future Appointments             In 3 months Dorcas Carrow, DO [facility]

## 2023-07-23 HISTORY — PX: BICEPS TENDON REPAIR: SHX566

## 2023-08-15 ENCOUNTER — Other Ambulatory Visit: Payer: Self-pay | Admitting: Family Medicine

## 2023-08-18 MED ORDER — SEMAGLUTIDE (1 MG/DOSE) 4 MG/3ML ~~LOC~~ SOPN: 1.0000 mg | PEN_INJECTOR | SUBCUTANEOUS | 0 refills | Status: DC

## 2023-09-19 ENCOUNTER — Ambulatory Visit: Payer: Self-pay | Admitting: Family Medicine

## 2023-09-22 ENCOUNTER — Other Ambulatory Visit: Payer: Self-pay | Admitting: Family Medicine

## 2023-09-23 NOTE — Telephone Encounter (Signed)
 Refilled 09/23/23, duplicate request.  Requested Prescriptions  Pending Prescriptions Disp Refills   OZEMPIC, 1 MG/DOSE, 4 MG/3ML SOPN [Pharmacy Med Name: OZEMPIC 1MG  PER DOSE (4MG /3ML) PFP] 9 mL 0    Sig: INJECT 1 MG UNDER THE SKIN ONCE A WEEK AS DIRECTED     Endocrinology:  Diabetes - GLP-1 Receptor Agonists - semaglutide Failed - 09/23/2023  2:08 PM      Failed - HBA1C in normal range and within 180 days    HB A1C (BAYER DCA - WAIVED)  Date Value Ref Range Status  03/19/2023 6.0 (H) 4.8 - 5.6 % Final    Comment:             Prediabetes: 5.7 - 6.4          Diabetes: >6.4          Glycemic control for adults with diabetes: <7.0          Failed - Valid encounter within last 6 months    Recent Outpatient Visits           6 months ago Routine general medical examination at a health care facility   [facility], Megan P, DO   9 months ago Controlled type 2 diabetes mellitus with hyperglycemia, without long-term current use of insulin (HCC)   [place] [facility] [place], Megan P, DO   1 year ago Controlled type 2 diabetes mellitus with hyperglycemia, without long-term current use of insulin (HCC)   [place] [facility] [address] [place], Megan P, DO   1 year ago Controlled type 2 diabetes mellitus with hyperglycemia, without long-term current use of insulin (HCC)   [place] [facility] [place], Megan P, DO   1 year ago Routine general medical examination at a health care facility   [facility] [place], [place] P, DO       Future Appointments             In 1 week Dorcas Carrow, DO [place] [facility]            Passed - Cr in normal range and within 360 days    Creatinine, Ser  Date Value Ref Range Status  03/19/2023 0.92 0.76 - 1.27 mg/dL Final

## 2023-09-23 NOTE — Telephone Encounter (Signed)
 Requested Prescriptions  Pending Prescriptions Disp Refills   OZEMPIC, 1 MG/DOSE, 4 MG/3ML SOPN [Pharmacy Med Name: OZEMPIC 1MG  PER DOSE (4MG /3ML) PFP] 9 mL 0    Sig: INJECT 1 MG UNDER THE SKIN ONCE A WEEK AS DIRECTED     Endocrinology:  Diabetes - GLP-1 Receptor Agonists - semaglutide Failed - 09/23/2023  2:07 PM      Failed - HBA1C in normal range and within 180 days    HB A1C (BAYER DCA - WAIVED)  Date Value Ref Range Status  03/19/2023 6.0 (H) 4.8 - 5.6 % Final    Comment:             Prediabetes: 5.7 - 6.4          Diabetes: >6.4          Glycemic control for adults with diabetes: <7.0          Failed - Valid encounter within last 6 months    Recent Outpatient Visits           6 months ago Routine general medical examination at a health care facility   [facility], Megan P, DO   9 months ago Controlled type 2 diabetes mellitus with hyperglycemia, without long-term current use of insulin (HCC)   [place] [facility] [place], Megan P, DO   1 year ago Controlled type 2 diabetes mellitus with hyperglycemia, without long-term current use of insulin (HCC)   [place] [facility], Megan P, DO   1 year ago Controlled type 2 diabetes mellitus with hyperglycemia, without long-term current use of insulin (HCC)   [place] [facility] [place], Megan P, DO   1 year ago Routine general medical examination at a health care facility   [facility] [place], [place] P, DO       Future Appointments             In 1 week Dorcas Carrow, DO [place] [facility]            Passed - Cr in normal range and within 360 days    Creatinine, Ser  Date Value Ref Range Status  03/19/2023 0.92 0.76 - 1.27 mg/dL Final

## 2023-09-30 ENCOUNTER — Ambulatory Visit: Payer: BC Managed Care – PPO | Admitting: Family Medicine

## 2023-09-30 ENCOUNTER — Encounter: Payer: Self-pay | Admitting: Family Medicine

## 2023-09-30 VITALS — BP 109/67 | HR 66 | Temp 98.3°F | Resp 15 | Ht 73.5 in | Wt 338.8 lb

## 2023-09-30 DIAGNOSIS — E782 Mixed hyperlipidemia: Secondary | ICD-10-CM

## 2023-09-30 DIAGNOSIS — Z7985 Long-term (current) use of injectable non-insulin antidiabetic drugs: Secondary | ICD-10-CM

## 2023-09-30 DIAGNOSIS — E1165 Type 2 diabetes mellitus with hyperglycemia: Secondary | ICD-10-CM | POA: Diagnosis not present

## 2023-09-30 MED ORDER — LISINOPRIL 2.5 MG PO TABS: 2.5000 mg | ORAL_TABLET | Freq: Every day | ORAL | 1 refills | Status: DC

## 2023-09-30 MED ORDER — METFORMIN HCL 1000 MG PO TABS: 1000.0000 mg | ORAL_TABLET | Freq: Every day | ORAL | 1 refills | Status: DC

## 2023-09-30 MED ORDER — ATORVASTATIN CALCIUM 20 MG PO TABS: 20.0000 mg | ORAL_TABLET | Freq: Every day | ORAL | 1 refills | Status: DC

## 2023-09-30 MED ORDER — ASPIRIN 81 MG PO TBEC: 81.0000 mg | DELAYED_RELEASE_TABLET | Freq: Every day | ORAL | 4 refills | Status: DC

## 2023-09-30 MED ORDER — OZEMPIC (1 MG/DOSE) 4 MG/3ML ~~LOC~~ SOPN: 2.0000 mg | PEN_INJECTOR | SUBCUTANEOUS | 1 refills | Status: DC

## 2023-09-30 NOTE — Progress Notes (Unsigned)
 There were no vitals taken for this visit.   Subjective:    Patient ID: Jimmy Flores, male    DOB: 09-16-1985, 38 y.o.   MRN: [id number]  HPI: Jimmy Flores is a 38 y.o. male  No chief complaint on file.  DIABETES Hypoglycemic episodes:{Blank single:19197::"yes","no"} Polydipsia/polyuria: {Blank single:19197::"yes","no"} Visual disturbance: {Blank single:19197::"yes","no"} Chest pain: {Blank single:19197::"yes","no"} Paresthesias: {Blank single:19197::"yes","no"} Glucose Monitoring: {Blank single:19197::"yes","no"}  Accucheck frequency: {Blank single:19197::"Not Checking","Daily","BID","TID"}  Fasting glucose:  Post prandial:  Evening:  Before meals: Taking Insulin?: {Blank single:19197::"yes","no"}  Long acting insulin:  Short acting insulin: Blood Pressure Monitoring: {Blank single:19197::"not checking","rarely","daily","weekly","monthly","a few times a day","a few times a week","a few times a month"} Retinal Examination: {Blank single:19197::"Up to Date","Not up to Date"} Foot Exam: {Blank single:19197::"Up to Date","Not up to Date"} Diabetic Education: {Blank single:19197::"Completed","Not Completed"} Pneumovax: {Blank single:19197::"Up to Date","Not up to Date","unknown"} Influenza: {Blank single:19197::"Up to Date","Not up to Date","unknown"} Aspirin: {Blank single:19197::"yes","no"}  HYPERTENSION / HYPERLIPIDEMIA Satisfied with current treatment? {Blank single:19197::"yes","no"} Duration of hypertension: {Blank single:19197::"chronic","months","years"} BP monitoring frequency: {Blank single:19197::"not checking","rarely","daily","weekly","monthly","a few times a day","a few times a week","a few times a month"} BP medication side effects: {Blank single:19197::"yes","no"} Past BP meds: {Blank multiple:19196::"none","amlodipine","amlodipine/benazepril","atenolol","benazepril","benazepril/HCTZ","bisoprolol  (bystolic)","carvedilol","chlorthalidone","clonidine","diltiazem","exforge HCT","HCTZ","irbesartan (avapro)","labetalol","lisinopril","lisinopril-HCTZ","losartan (cozaar)","methyldopa","nifedipine","olmesartan (benicar)","olmesartan-HCTZ","quinapril","ramipril","spironalactone","tekturna","valsartan","valsartan-HCTZ","verapamil"} Duration of hyperlipidemia: {Blank single:19197::"chronic","months","years"} Cholesterol medication side effects: {Blank single:19197::"yes","no"} Cholesterol supplements: {Blank multiple:19196::"none","fish oil","niacin","red yeast rice"} Past cholesterol medications: {Blank multiple:19196::"none","atorvastain (lipitor)","lovastatin (mevacor)","pravastatin (pravachol)","rosuvastatin (crestor)","simvastatin (zocor)","vytorin","fenofibrate (tricor)","gemfibrozil","ezetimide (zetia)","niaspan","lovaza"} Medication compliance: {Blank single:19197::"excellent compliance","good compliance","fair compliance","poor compliance"} Aspirin: {Blank single:19197::"yes","no"} Recent stressors: {Blank single:19197::"yes","no"} Recurrent headaches: {Blank single:19197::"yes","no"} Visual changes: {Blank single:19197::"yes","no"} Palpitations: {Blank single:19197::"yes","no"} Dyspnea: {Blank single:19197::"yes","no"} Chest pain: {Blank single:19197::"yes","no"} Lower extremity edema: {Blank single:19197::"yes","no"} Dizzy/lightheaded: {Blank single:19197::"yes","no"}   Relevant past medical, surgical, family and social history reviewed and updated as indicated. Interim medical history since our last visit reviewed. Allergies and medications reviewed and updated.  Review of Systems  Per HPI unless specifically indicated above     Objective:    There were no vitals taken for this visit.  Wt Readings from Last 3 Encounters:  03/19/23 (!) 358 lb 12.8 oz (162.8 kg)  12/18/22 (!) 379 lb 9.6 oz (172.2 kg)  09/17/22 (!) 369 lb (167.4 kg)    Physical Exam  Results for orders placed  or performed in visit on 03/19/23  Urinalysis, Routine w reflex microscopic   Collection Time: 03/19/23 10:39 AM  Result Value Ref Range   Specific Gravity, UA 1.025 1.005 - 1.030   pH, UA 6.0 5.0 - 7.5   Color, UA Yellow Yellow   Appearance Ur Clear Clear   Leukocytes,UA Negative Negative   Protein,UA Trace (A) Negative/Trace   Glucose, UA Negative Negative   Ketones, UA Negative Negative   RBC, UA Negative Negative   Bilirubin, UA Negative Negative   Urobilinogen, Ur 1.0 0.2 - 1.0 mg/dL   Nitrite, UA Negative Negative   Microscopic Examination Comment   Bayer DCA Hb A1c Waived   Collection Time: 03/19/23 10:39 AM  Result Value Ref Range   HB A1C (BAYER DCA - WAIVED) 6.0 (H) 4.8 - 5.6 %  Microalbumin, Urine Waived   Collection Time: 03/19/23 10:39 AM  Result Value Ref Range   Microalb, Ur Waived 30 (H) 0 - 19 mg/L   Creatinine, Urine Waived 300 10 - 300 mg/dL   Microalb/Creat Ratio <30 <30 mg/g  Comprehensive metabolic panel   Collection Time: 03/19/23 10:41 AM  Result Value Ref Range   Glucose 87 70 - 99 mg/dL   BUN 14 6 - 20 mg/dL   Creatinine, Ser 4.09  0.76 - 1.27 mg/dL   eGFR 161 >09 UE/AVW/0.98   BUN/Creatinine Ratio 15 9 - 20   Sodium 140 134 - 144 mmol/L   Potassium 4.8 3.5 - 5.2 mmol/L   Chloride 102 96 - 106 mmol/L   CO2 23 20 - 29 mmol/L   Calcium 9.1 8.7 - 10.2 mg/dL   Total Protein 7.1 6.0 - 8.5 g/dL   Albumin 4.3 4.1 - 5.1 g/dL   Globulin, Total 2.8 1.5 - 4.5 g/dL   Bilirubin Total 0.5 0.0 - 1.2 mg/dL   Alkaline Phosphatase 113 44 - 121 IU/L   AST 19 0 - 40 IU/L   ALT 38 0 - 44 IU/L  CBC with Differential/Platelet   Collection Time: 03/19/23 10:41 AM  Result Value Ref Range   WBC 6.3 3.4 - 10.8 x10E3/uL   RBC 5.16 4.14 - 5.80 x10E6/uL   Hemoglobin 14.6 13.0 - 17.7 g/dL   Hematocrit 11.9 14.7 - 51.0 %   MCV 84 79 - 97 fL   MCH 28.3 26.6 - 33.0 pg   MCHC 33.6 31.5 - 35.7 g/dL   RDW 82.9 56.2 - 13.0 %   Platelets 243 150 - 450 x10E3/uL    Neutrophils 61 Not Estab. %   Lymphs 30 Not Estab. %   Monocytes 5 Not Estab. %   Eos 3 Not Estab. %   Basos 1 Not Estab. %   Neutrophils Absolute 3.9 1.4 - 7.0 x10E3/uL   Lymphocytes Absolute 1.9 0.7 - 3.1 x10E3/uL   Monocytes Absolute 0.3 0.1 - 0.9 x10E3/uL   EOS (ABSOLUTE) 0.2 0.0 - 0.4 x10E3/uL   Basophils Absolute 0.0 0.0 - 0.2 x10E3/uL   Immature Granulocytes 0 Not Estab. %   Immature Grans (Abs) 0.0 0.0 - 0.1 x10E3/uL  Lipid Panel w/o Chol/HDL Ratio   Collection Time: 03/19/23 10:41 AM  Result Value Ref Range   Cholesterol, Total 159 100 - 199 mg/dL   Triglycerides 58 0 - 149 mg/dL   HDL 43 >86 mg/dL   VLDL Cholesterol Cal 12 5 - 40 mg/dL   LDL Chol Calc (NIH) 578 (H) 0 - 99 mg/dL  TSH   Collection Time: 03/19/23 10:41 AM  Result Value Ref Range   TSH 2.050 0.450 - 4.500 uIU/mL      Assessment & Plan:   Problem List Items Addressed This Visit       Endocrine   DM II (diabetes mellitus, type II), controlled (HCC) - Primary   Relevant Orders   Bayer DCA Hb A1c Waived   CBC with Differential/Platelet   Lipid Panel w/o Chol/HDL Ratio   Comprehensive metabolic panel     Other   Hyperlipidemia   Relevant Orders   Lipid Panel w/o Chol/HDL Ratio   Comprehensive metabolic panel     Follow up plan: No follow-ups on file.

## 2023-10-01 ENCOUNTER — Encounter: Payer: Self-pay | Admitting: Family Medicine

## 2023-10-01 LAB — CBC WITH DIFFERENTIAL/PLATELET
Basophils Absolute: 0 10*3/uL (ref 0.0–0.2)
Basos: 0 %
EOS (ABSOLUTE): 0.2 10*3/uL (ref 0.0–0.4)
Eos: 3 %
Hematocrit: 41.9 % (ref 37.5–51.0)
Hemoglobin: 14.1 g/dL (ref 13.0–17.7)
Immature Grans (Abs): 0 10*3/uL (ref 0.0–0.1)
Immature Granulocytes: 0 %
Lymphocytes Absolute: 2.2 10*3/uL (ref 0.7–3.1)
Lymphs: 29 %
MCH: 28.7 pg (ref 26.6–33.0)
MCHC: 33.7 g/dL (ref 31.5–35.7)
MCV: 85 fL (ref 79–97)
Monocytes Absolute: 0.4 10*3/uL (ref 0.1–0.9)
Monocytes: 5 %
Neutrophils Absolute: 4.8 10*3/uL (ref 1.4–7.0)
Neutrophils: 63 %
Platelets: 229 10*3/uL (ref 150–450)
RBC: 4.91 x10E6/uL (ref 4.14–5.80)
RDW: 13.5 % (ref 11.6–15.4)
WBC: 7.7 10*3/uL (ref 3.4–10.8)

## 2023-10-01 LAB — LIPID PANEL W/O CHOL/HDL RATIO
Cholesterol, Total: 147 mg/dL (ref 100–199)
HDL: 42 mg/dL (ref >39)
LDL Chol Calc (NIH): 90 mg/dL (ref 0–99)
Triglycerides: 75 mg/dL (ref 0–149)
VLDL Cholesterol Cal: 15 mg/dL (ref 5–40)

## 2023-10-01 LAB — COMPREHENSIVE METABOLIC PANEL
ALT: 32 IU/L (ref 0–44)
AST: 23 IU/L (ref 0–40)
Albumin: 4.4 g/dL (ref 4.1–5.1)
Alkaline Phosphatase: 109 IU/L (ref 44–121)
BUN/Creatinine Ratio: 12 (ref 9–20)
BUN: 10 mg/dL (ref 6–20)
Bilirubin Total: 0.5 mg/dL (ref 0.0–1.2)
CO2: 22 mmol/L (ref 20–29)
Calcium: 8.8 mg/dL (ref 8.7–10.2)
Chloride: 102 mmol/L (ref 96–106)
Creatinine, Ser: 0.82 mg/dL (ref 0.76–1.27)
Globulin, Total: 2.4 g/dL (ref 1.5–4.5)
Glucose: 78 mg/dL (ref 70–99)
Potassium: 4 mmol/L (ref 3.5–5.2)
Sodium: 140 mmol/L (ref 134–144)
Total Protein: 6.8 g/dL (ref 6.0–8.5)
eGFR: 115 mL/min/{1.73_m2} (ref >59)

## 2023-10-01 LAB — BAYER DCA HB A1C WAIVED: HB A1C (BAYER DCA - WAIVED): 5.5 % (ref 4.8–5.6)

## 2023-10-01 NOTE — Assessment & Plan Note (Signed)
 Sugars doing great at 5.5. Continue current regimen. Continue to monitor. Call with any concerns. Will cut his lisinopril to 2.5mg . Continue to monitor. Labs drawn today.

## 2023-10-01 NOTE — Assessment & Plan Note (Signed)
 Under good control on current regimen. Continue current regimen. Continue to monitor. Call with any concerns. Refills given. Labs drawn today.

## 2023-10-01 NOTE — Assessment & Plan Note (Signed)
 Congratulated patient on 41lb weight loss! Continue diet and exercise. Call with any concerns.

## 2023-11-10 ENCOUNTER — Telehealth: Payer: Self-pay

## 2023-11-10 NOTE — Telephone Encounter (Signed)
 Copied from CRM [id number]. Topic: Clinical - Prescription Issue >> Nov 10, 2023  9:02 AM Donald Frost wrote: Reason for CRM: The patient called stating he took his last does of Semaglutide , 1 MG/DOSE, (OZEMPIC , 1 MG/DOSE,) 4 MG/3ML SOPN yesterday and he needs his meds called in so he can take them by this Sunday. He says he spoke with someone about a week ago who stated they were working on a prior authorization or something like that. I do not see anything in the file from a week ago. The last thing I see is from a month ago. Please assist patient further

## 2023-11-10 NOTE — Telephone Encounter (Signed)
 Spoke with insurance company. There is actually a current prior auth on file through next month. The pharmacy needs to submit the PA number with each fill or it will not approve.   Left message for patient with information to reach out directly to his pharmacy or his insurance if assistance is still needed. We will work on the authorization that is set to expire next month.

## 2023-11-17 NOTE — Telephone Encounter (Signed)
 Pharmacy is aware that insurance states there is an active PA on file and therefore shouldn't have issues filling . They will reach out to insurance today for assistance.

## 2023-11-17 NOTE — Telephone Encounter (Signed)
 Copied from CRM [id number]. Topic: Clinical - Prescription Issue >> Nov 10, 2023  9:02 AM Jimmy Flores wrote: Reason for CRM: The patient called stating he took his last does of Semaglutide , 1 MG/DOSE, (OZEMPIC , 1 MG/DOSE,) 4 MG/3ML SOPN yesterday and he needs his meds called in so he can take them by this Sunday. He says he spoke with someone about a week ago who stated they were working on a prior authorization or something like that. I do not see anything in the file from a week ago. The last thing I see is from a month ago. Please assist patient further >> Nov 17, 2023  3:06 PM Jimmy Flores wrote: Patient called upset he is out of medication.[facility] said [facility] will not fill [facility].  I did adv him of the notes from the office stating the pharmacy needs to use prior auth.  He still wants us  to call him and or the pharmacy to get it filled.Jimmy Flores

## 2024-08-10 ENCOUNTER — Ambulatory Visit: Admitting: Family Medicine

## 2024-08-10 ENCOUNTER — Encounter: Payer: Self-pay | Admitting: Family Medicine

## 2024-08-10 ENCOUNTER — Other Ambulatory Visit: Payer: Self-pay

## 2024-08-10 VITALS — BP 119/77 | HR 72 | Temp 97.8°F | Resp 17 | Ht 73.5 in | Wt 379.0 lb

## 2024-08-10 DIAGNOSIS — Z7985 Long-term (current) use of injectable non-insulin antidiabetic drugs: Secondary | ICD-10-CM | POA: Diagnosis not present

## 2024-08-10 DIAGNOSIS — Z23 Encounter for immunization: Secondary | ICD-10-CM | POA: Diagnosis not present

## 2024-08-10 DIAGNOSIS — Z0000 Encounter for general adult medical examination without abnormal findings: Secondary | ICD-10-CM

## 2024-08-10 DIAGNOSIS — M7660 Achilles tendinitis, unspecified leg: Secondary | ICD-10-CM

## 2024-08-10 DIAGNOSIS — E782 Mixed hyperlipidemia: Secondary | ICD-10-CM | POA: Diagnosis not present

## 2024-08-10 DIAGNOSIS — R809 Proteinuria, unspecified: Secondary | ICD-10-CM | POA: Diagnosis not present

## 2024-08-10 DIAGNOSIS — E1129 Type 2 diabetes mellitus with other diabetic kidney complication: Secondary | ICD-10-CM | POA: Diagnosis not present

## 2024-08-10 LAB — MICROALBUMIN, URINE WAIVED
Creatinine, Urine Waived: 300 mg/dL (ref 10–300)
Microalb, Ur Waived: 150 mg/L — ABNORMAL HIGH (ref 0–19)

## 2024-08-10 LAB — BAYER DCA HB A1C WAIVED: HB A1C (BAYER DCA - WAIVED): 6.6 % — ABNORMAL HIGH (ref 4.8–5.6)

## 2024-08-10 MED ORDER — SEMAGLUTIDE (1 MG/DOSE) 4 MG/3ML ~~LOC~~ SOPN
1.0000 mg | PEN_INJECTOR | SUBCUTANEOUS | 1 refills | Status: AC
  Filled 2024-08-10: qty 9, 84d supply, fill #0

## 2024-08-10 MED ORDER — ATORVASTATIN CALCIUM 20 MG PO TABS: 20.0000 mg | ORAL_TABLET | Freq: Every day | ORAL | 1 refills | Status: AC

## 2024-08-10 MED ORDER — ASPIRIN 81 MG PO TBEC: 81.0000 mg | DELAYED_RELEASE_TABLET | Freq: Every day | ORAL | 4 refills | Status: AC

## 2024-08-10 MED ORDER — METFORMIN HCL 1000 MG PO TABS: 1000.0000 mg | ORAL_TABLET | Freq: Every day | ORAL | 1 refills | Status: AC

## 2024-08-10 MED ORDER — OZEMPIC (0.25 OR 0.5 MG/DOSE) 2 MG/3ML ~~LOC~~ SOPN: 0.2500 mg | PEN_INJECTOR | SUBCUTANEOUS | Status: AC

## 2024-08-10 MED ORDER — LISINOPRIL 2.5 MG PO TABS: 2.5000 mg | ORAL_TABLET | Freq: Every day | ORAL | 1 refills | Status: AC

## 2024-08-10 MED ORDER — OZEMPIC (0.25 OR 0.5 MG/DOSE) 2 MG/3ML ~~LOC~~ SOPN
0.5000 mg | PEN_INJECTOR | SUBCUTANEOUS | 0 refills | Status: AC
  Filled 2024-08-10: qty 3, 28d supply, fill #0

## 2024-08-10 NOTE — Assessment & Plan Note (Signed)
 A1c up to 6.6. Has been off his ozempic  for 4-5 months. Will restart and recheck in about 6 weeks. Concern about the insurance- will get pharmacy team involved. Call with any concerns.

## 2024-08-10 NOTE — Patient Instructions (Addendum)
 Take 0.25mg  of your ozempic  weekly for the next 4 weeks (all in the sample pen) Take 0.5mg  of your ozempic  weekly for 4 weeks after that (2 doses in sample pen- then Rx at [facility]) Take 1mg  of your ozempic  after that (can do 2 shots of your 0.5mg  to use up what you have, then use the Rx at [facility])  Foot exam done today- monofilament exam in tact, no deformities, pulses in tact  Diabetes Health Maintenance Due  Topic Date Due   HEMOGLOBIN A1C  04/01/2024   OPHTHALMOLOGY EXAM  11/29/2024 (Originally 03/23/2023)   FOOT EXAM  08/10/2025

## 2024-08-10 NOTE — Assessment & Plan Note (Signed)
 Under good control on current regimen. Continue current regimen. Continue to monitor. Call with any concerns. Refills given. Labs drawn today.

## 2024-08-10 NOTE — Assessment & Plan Note (Signed)
 Encouraged diet and exercise with goal of losing 1-2lbs per week. Call with any concerns.
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
## 2024-08-11 ENCOUNTER — Telehealth: Payer: Self-pay

## 2024-08-11 LAB — CBC WITH DIFFERENTIAL/PLATELET
Basophils Absolute: 0 x10E3/uL (ref 0.0–0.2)
Basos: 1 %
EOS (ABSOLUTE): 0.2 x10E3/uL (ref 0.0–0.4)
Eos: 4 %
Hematocrit: 43.8 % (ref 37.5–51.0)
Hemoglobin: 14.4 g/dL (ref 13.0–17.7)
Immature Grans (Abs): 0 x10E3/uL (ref 0.0–0.1)
Immature Granulocytes: 0 %
Lymphocytes Absolute: 1.7 x10E3/uL (ref 0.7–3.1)
Lymphs: 26 %
MCH: 29 pg (ref 26.6–33.0)
MCHC: 32.9 g/dL (ref 31.5–35.7)
MCV: 88 fL (ref 79–97)
Monocytes Absolute: 0.3 x10E3/uL (ref 0.1–0.9)
Monocytes: 5 %
Neutrophils Absolute: 4.2 x10E3/uL (ref 1.4–7.0)
Neutrophils: 64 %
Platelets: 230 x10E3/uL (ref 150–450)
RBC: 4.97 x10E6/uL (ref 4.14–5.80)
RDW: 13.3 % (ref 11.6–15.4)
WBC: 6.4 x10E3/uL (ref 3.4–10.8)

## 2024-08-11 LAB — COMPREHENSIVE METABOLIC PANEL WITH GFR
ALT: 33 IU/L (ref 0–44)
AST: 21 IU/L (ref 0–40)
Albumin: 4.3 g/dL (ref 4.1–5.1)
Alkaline Phosphatase: 95 IU/L (ref 47–123)
BUN/Creatinine Ratio: 12 (ref 9–20)
BUN: 10 mg/dL (ref 6–24)
Bilirubin Total: 0.5 mg/dL (ref 0.0–1.2)
CO2: 22 mmol/L (ref 20–29)
Calcium: 9.1 mg/dL (ref 8.7–10.2)
Chloride: 103 mmol/L (ref 96–106)
Creatinine, Ser: 0.84 mg/dL (ref 0.76–1.27)
Globulin, Total: 2.6 g/dL (ref 1.5–4.5)
Glucose: 102 mg/dL — ABNORMAL HIGH (ref 70–99)
Potassium: 4.5 mmol/L (ref 3.5–5.2)
Sodium: 141 mmol/L (ref 134–144)
Total Protein: 6.9 g/dL (ref 6.0–8.5)
eGFR: 113 mL/min/1.73

## 2024-08-11 LAB — PSA: Prostate Specific Ag, Serum: 1.8 ng/mL (ref 0.0–4.0)

## 2024-08-11 LAB — LIPID PANEL W/O CHOL/HDL RATIO
Cholesterol, Total: 181 mg/dL (ref 100–199)
HDL: 49 mg/dL
LDL Chol Calc (NIH): 120 mg/dL — ABNORMAL HIGH (ref 0–99)
Triglycerides: 63 mg/dL (ref 0–149)
VLDL Cholesterol Cal: 12 mg/dL (ref 5–40)

## 2024-08-11 LAB — TSH: TSH: 1.49 u[IU]/mL (ref 0.450–4.500)

## 2024-08-11 NOTE — Progress Notes (Signed)
 Care Guide Pharmacy Note  08/11/2024 Name: Jimmy Flores MRN: [id number] DOB: 05-12-1986  Referred By: Vicci Duwaine SQUIBB, DO Reason for referral: Complex Care Management (Outreach to Schedule with Pharm d )   Jimmy Flores is a 39 y.o. year old male who is a primary care patient of Vicci Duwaine SQUIBB, DO.  Jimmy Flores was referred to the pharmacist for assistance related to: DMII  An unsuccessful telephone outreach was attempted today to contact the patient who was referred to the pharmacy team for assistance with medication assistance. Additional attempts will be made to contact the patient.  Jimmy Flores , RMA     [facility]  [facility], [facility] Guide  Direct Dial: [phone]  Website: [email or web address]

## 2024-08-13 LAB — SPECIMEN STATUS REPORT

## 2024-08-13 LAB — HEPATITIS B SURFACE ANTIBODY, QUANTITATIVE: Hepatitis B Surf Ab Quant: <3.5 m[IU]/mL — AB

## 2024-08-13 NOTE — Progress Notes (Signed)
 Care Guide Pharmacy Note  08/13/2024 Name: [name] MRN: [id number] DOB: Sep 07, 1985  Referred By: Vicci Duwaine SQUIBB, DO Reason for referral: Complex Care Management (Outreach to Schedule with Pharm d )   Jimmy Flores is a 39 y.o. year old male who is a primary care patient of Vicci Duwaine SQUIBB, DO.  Amear Strojny was referred to the pharmacist for assistance related to: DMII  Successful contact was made with the patient to discuss pharmacy services including being ready for the pharmacist to call at least 5 minutes before the scheduled appointment time and to have medication bottles and any blood pressure readings ready for review. The patient agreed to meet with the pharmacist via telephone visit on (date/time).09/01/2024  Jeoffrey Buffalo , RMA     [place]  [facility], [facility] Guide  Direct Dial: [phone]  Website: [email or web address]

## 2024-08-14 ENCOUNTER — Ambulatory Visit: Payer: Self-pay | Admitting: Family Medicine

## 2024-08-23 ENCOUNTER — Other Ambulatory Visit: Payer: Self-pay

## 2024-08-26 ENCOUNTER — Telehealth: Payer: Self-pay

## 2024-08-26 ENCOUNTER — Other Ambulatory Visit (HOSPITAL_COMMUNITY): Payer: Self-pay

## 2024-08-26 NOTE — Telephone Encounter (Signed)
 Pharmacy Patient Advocate Encounter  Received notification from [facility] that Prior Authorization for OZEMPIC  AUTO INJECTORS has been APPROVED from 08/21/24 to 08/21/25   PA #/Case ID/Reference #: [id number]

## 2024-09-01 ENCOUNTER — Other Ambulatory Visit

## 2024-09-17 ENCOUNTER — Encounter: Admitting: Family Medicine

## 2024-09-24 ENCOUNTER — Ambulatory Visit: Admitting: Family Medicine
